# Patient Record
Sex: Male | Born: 1985 | Race: Black or African American | Hispanic: No | Marital: Single | Smoking: Current every day smoker
Health system: Southern US, Community
[De-identification: ages and names within clinical notes are randomized; demographics above are authoritative.]

## PROBLEM LIST (undated history)

## (undated) HISTORY — PX: APPENDECTOMY: SHX54

---

## 2008-11-22 ENCOUNTER — Emergency Department (HOSPITAL_BASED_OUTPATIENT_CLINIC_OR_DEPARTMENT_OTHER): Admission: EM | Admit: 2008-11-22 | Discharge: 2008-11-22 | Payer: Self-pay | Admitting: Emergency Medicine

## 2008-11-22 ENCOUNTER — Ambulatory Visit: Payer: Self-pay | Admitting: Diagnostic Radiology
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
## 2011-01-14 LAB — BASIC METABOLIC PANEL
BUN: 12 mg/dL (ref 6–23)
CO2: 27 mEq/L (ref 19–32)
Chloride: 104 mEq/L (ref 96–112)
Creatinine, Ser: 1.2 mg/dL (ref 0.4–1.5)
GFR calc Af Amer: >60 mL/min (ref >60)
Potassium: 4.4 mEq/L (ref 3.5–5.1)

## 2014-09-05 ENCOUNTER — Emergency Department (HOSPITAL_COMMUNITY)
Admission: EM | Admit: 2014-09-05 | Discharge: 2014-09-05 | Disposition: A | Discharge status: Home / Self Care | Payer: Self-pay | Attending: Emergency Medicine | Admitting: Emergency Medicine

## 2014-09-05 ENCOUNTER — Encounter (HOSPITAL_COMMUNITY): Payer: Self-pay | Admitting: Emergency Medicine

## 2014-09-05 DIAGNOSIS — Z791 Long term (current) use of non-steroidal anti-inflammatories (NSAID): Secondary | ICD-10-CM | POA: Insufficient documentation

## 2014-09-05 DIAGNOSIS — K088 Other specified disorders of teeth and supporting structures: Secondary | ICD-10-CM | POA: Insufficient documentation

## 2014-09-05 DIAGNOSIS — Z720 Tobacco use: Secondary | ICD-10-CM | POA: Insufficient documentation

## 2014-09-05 DIAGNOSIS — T85848A Pain due to other internal prosthetic devices, implants and grafts, initial encounter: Secondary | ICD-10-CM

## 2014-09-05 DIAGNOSIS — Z880 Allergy status to penicillin: Secondary | ICD-10-CM | POA: Insufficient documentation

## 2014-09-05 MED ORDER — TRAMADOL HCL 50 MG PO TABS: 50.0000 mg | ORAL_TABLET | Freq: Two times a day (BID) | ORAL | Status: DC | PRN

## 2014-09-05 MED ORDER — TRAMADOL HCL 50 MG PO TABS
50.0000 mg | ORAL_TABLET | Freq: Once | ORAL | Status: AC
  Administered 2014-09-05: 50 mg via ORAL
  Filled 2014-09-05: qty 1

## 2014-09-05 NOTE — ED Notes (Signed)
Patient c/o right, upper and lower dental pain x2-3 days. Patient has tried OTC Orajel and Ibuprofen without relief. Last does ibuprofen PM @0030 . Patient states he does not have a Education officer, communitydentist.

## 2014-09-05 NOTE — Discharge Instructions (Signed)
Dental Pain Dale Wells, you were seen today for dental pain. You need to have her teeth pulled by a dentist. Take pain medication as prescribed.  Continue to take Motrin as well. If your symptoms worsen or if you develop an abscess, come back to emergency department for evaluation. Toothache is pain in or around a tooth. It may get worse with chewing or with cold or heat.  HOME CARE  Your dentist may use a numbing medicine during treatment. If so, you may need to avoid eating until the medicine wears off. Ask your dentist about this.  Only take medicine as told by your dentist or doctor.  Avoid chewing food near the painful tooth until after all treatment is done. Ask your dentist about this. GET HELP RIGHT AWAY IF:   The problem gets worse or new problems appear.  You have a fever.  There is redness and puffiness (swelling) of the face, jaw, or neck.  You cannot open your mouth.  There is pain in the jaw.  There is very bad pain that is not helped by medicine. MAKE SURE YOU:   Understand these instructions.  Will watch your condition.  Will get help right away if you are not doing well or get worse. Document Released: 03/03/2008 Document Revised: 12/08/2011 Document Reviewed: 03/03/2008 [facility] Patient Information 2015 [place]. This information is not intended to replace advice given to you by your health care provider. Make sure you discuss any questions you have with your health care provider.

## 2014-09-05 NOTE — ED Provider Notes (Signed)
CSN: [id number]     Arrival date & time 09/05/14  0234 History   First MD Initiated Contact with Patient 09/05/14 0246     Chief Complaint  Patient presents with  . Dental Pain    right sided, upper & lower     (Consider location/radiation/quality/duration/timing/severity/associated sxs/prior Treatment) HPI  Dale Wells is a 28 y.o. male with no second past medical history coming in with dental pain. Patient states he has pain in his #1 through 3 teeth as well as his #30 through 32 teeth. This is occurred for the past week. He denies any but states that has been swelling. He has taken Orajel and Motrin without any relief. He states he has had multiple cavities and has had teeth pulled in the past. Patient currently does not have a dentist. Denies any fevers, he has no further complaints.  10 Systems reviewed and are negative for acute change except as noted in the HPI.    History reviewed. No pertinent past medical history. Past Surgical History  Procedure Laterality Date  . Appendectomy     No family history on file. History  Substance Use Topics  . Smoking status: Current Every Day Smoker -- 0.50 packs/day    Types: Cigarettes  . Smokeless tobacco: Not on file  . Alcohol Use: No    Review of Systems    Allergies  Penicillins  Home Medications   Prior to Admission medications   Medication Sig Start Date End Date Taking? Authorizing Provider  benzocaine (ORAJEL) 10 % mucosal gel Use as directed 1 application in the mouth or throat as needed for mouth pain.   Yes Historical Provider, MD  ibuprofen (ADVIL,MOTRIN) 200 MG tablet Take 400 mg by mouth every 6 (six) hours as needed for moderate pain.   Yes Historical Provider, MD  Ibuprofen-Diphenhydramine Cit (ADVIL PM) 200-38 MG TABS Take 4 tablets by mouth at bedtime as needed (pain).   Yes Historical Provider, MD  naproxen sodium (ANAPROX) 220 MG tablet Take 220 mg by mouth 2 (two) times daily as needed (pain).   Yes  Historical Provider, MD   BP 139/70 mmHg  Pulse 61  Temp(Src) 98.5 F (36.9 C)  Resp 18  Ht 6' (1.829 m)  Wt 166 lb (75.297 kg)  BMI 22.51 kg/m2  SpO2 99% Physical Exam  Constitutional: He is oriented to person, place, and time. Vital signs are normal. He appears well-developed and well-nourished.  Non-toxic appearance. He does not appear ill. No distress.  HENT:  Head: Normocephalic and atraumatic.  Nose: Nose normal.  Mouth/Throat: Oropharynx is clear and moist. No oropharyngeal exudate.  Poor dentition diffusely, worse at tooth #31 with obvious distraction and it is tender to palpation. There is no nerve exposure.  Eyes: Conjunctivae and EOM are normal. Pupils are equal, round, and reactive to light. No scleral icterus.  Neck: Normal range of motion. Neck supple. No tracheal deviation, no edema, no erythema and normal range of motion present. No thyroid mass and no thyromegaly present.  Cardiovascular: Normal rate, regular rhythm, S1 normal, S2 normal, normal heart sounds, intact distal pulses and normal pulses.  Exam reveals no gallop and no friction rub.   No murmur heard. Pulses:      Radial pulses are 2+ on the right side, and 2+ on the left side.       Dorsalis pedis pulses are 2+ on the right side, and 2+ on the left side.  Pulmonary/Chest: Effort normal and breath sounds normal. No  respiratory distress. He has no wheezes. He has no rhonchi. He has no rales.  Abdominal: Soft. Normal appearance and bowel sounds are normal. He exhibits no distension, no ascites and no mass. There is no hepatosplenomegaly. There is no tenderness. There is no rebound, no guarding and no CVA tenderness.  Musculoskeletal: Normal range of motion. He exhibits no edema or tenderness.  Lymphadenopathy:    He has no cervical adenopathy.  Neurological: He is alert and oriented to person, place, and time. He has normal strength. No cranial nerve deficit or sensory deficit. GCS eye subscore is 4. GCS verbal  subscore is 5. GCS motor subscore is 6.  Skin: Skin is warm, dry and intact. No petechiae and no rash noted. He is not diaphoretic. No erythema. No pallor.  Psychiatric: He has a normal mood and affect. His behavior is normal. Judgment normal.  Nursing note and vitals reviewed.   ED Course  Procedures (including critical care time) Labs Review Labs Reviewed - No data to display  Imaging Review No results found.   EKG Interpretation None      MDM   Final diagnoses:  None    Patient present to emergency department for dental pain. There is no obvious swelling or abscess seen. He will be given tramadol for more pain relief, advised to continue Motrin. He was advised to follow-up with a dentist for extraction. His vital signs were within his normal limits and he is safe for discharge.    Tomasita CrumbleAdeleke Faron Whitelock, MD 09/05/14 [phone]

## 2014-09-05 NOTE — ED Notes (Signed)
MD at bedside. 

## 2015-10-21 ENCOUNTER — Encounter (HOSPITAL_COMMUNITY): Payer: Self-pay | Admitting: *Deleted

## 2015-10-21 ENCOUNTER — Emergency Department (HOSPITAL_COMMUNITY)
Admission: EM | Admit: 2015-10-21 | Discharge: 2015-10-21 | Disposition: A | Discharge status: Home / Self Care | Payer: Self-pay | Attending: Emergency Medicine | Admitting: Emergency Medicine

## 2015-10-21 DIAGNOSIS — R300 Dysuria: Secondary | ICD-10-CM | POA: Insufficient documentation

## 2015-10-21 DIAGNOSIS — F17210 Nicotine dependence, cigarettes, uncomplicated: Secondary | ICD-10-CM | POA: Insufficient documentation

## 2015-10-21 DIAGNOSIS — Z113 Encounter for screening for infections with a predominantly sexual mode of transmission: Secondary | ICD-10-CM | POA: Insufficient documentation

## 2015-10-21 DIAGNOSIS — Z880 Allergy status to penicillin: Secondary | ICD-10-CM | POA: Insufficient documentation

## 2015-10-21 LAB — URINALYSIS, ROUTINE W REFLEX MICROSCOPIC
BILIRUBIN URINE: NEGATIVE
Glucose, UA: NEGATIVE mg/dL
HGB URINE DIPSTICK: NEGATIVE
KETONES UR: NEGATIVE mg/dL
NITRITE: NEGATIVE
PROTEIN: NEGATIVE mg/dL
SPECIFIC GRAVITY, URINE: 1.026 (ref 1.005–1.030)
pH: 6 (ref 5.0–8.0)

## 2015-10-21 LAB — URINE MICROSCOPIC-ADD ON
Bacteria, UA: NONE SEEN
RBC / HPF: NONE SEEN RBC/hpf (ref 0–5)
SQUAMOUS EPITHELIAL / LPF: NONE SEEN

## 2015-10-21 MED ORDER — LIDOCAINE HCL 2 % IJ SOLN
20.0000 mL | Freq: Once | INTRAMUSCULAR | Status: AC
  Administered 2015-10-21: 30 mg

## 2015-10-21 MED ORDER — AZITHROMYCIN 250 MG PO TABS
1000.0000 mg | ORAL_TABLET | Freq: Once | ORAL | Status: AC
  Administered 2015-10-21: 1000 mg via ORAL
  Filled 2015-10-21: qty 4

## 2015-10-21 MED ORDER — CEFTRIAXONE SODIUM 250 MG IJ SOLR
250.0000 mg | Freq: Once | INTRAMUSCULAR | Status: AC
  Administered 2015-10-21: 250 mg via INTRAMUSCULAR
  Filled 2015-10-21: qty 250

## 2015-10-21 NOTE — ED Notes (Signed)
Pt reports possible STD exposure.  Reports "tingling" sensation when urinating.

## 2015-10-21 NOTE — Discharge Instructions (Signed)
You were seen in the ER today for screening for STIs. Your urine test did show signs of possible infection. We treated you empirically with antibiotics. We will call you in a few days if any of your tests come back positive.   Return to the emergency room for worsening condition or new concerning symptoms. Follow up with your regular doctor. If you don't have a regular doctor use one of the numbers below to establish a primary care doctor.   Emergency Department Resource Guide 1) Find a Doctor and Pay Out of Pocket Although you won't have to find out who is covered by your insurance plan, it is a good idea to ask around and get recommendations. You will then need to call the office and see if the doctor you have chosen will accept you as a new patient and what types of options they offer for patients who are self-pay. Some doctors offer discounts or will set up payment plans for their patients who do not have insurance, but you will need to ask so you aren't surprised when you get to your appointment.  2) Contact Your Local Health Department Not all health departments have doctors that can see patients for sick visits, but many do, so it is worth a call to see if yours does. If you don't know where your local health department is, you can check in your phone book. The CDC also has a tool to help you locate your state's health department, and many state websites also have listings of all of their local health departments.  3) Find a Walk-in Clinic If your illness is not likely to be very severe or complicated, you may want to try a walk in clinic. These are popping up all over the country in pharmacies, drugstores, and shopping centers. They're usually staffed by nurse practitioners or physician assistants that have been trained to treat common illnesses and complaints. They're usually fairly quick and inexpensive. However, if you have serious medical issues or chronic medical problems, these are  probably not your best option.  No Primary Care Doctor: - Call Health Connect at  [phone] - they can help you locate a primary care doctor that  accepts your insurance, provides certain services, etc. - Physician Referral [phone]  Emergency Department Resource Guide 1) Find a Doctor and Pay Out of Pocket Although you won't have to find out who is covered by your insurance plan, it is a good idea to ask around and get recommendations. You will then need to call the office and see if the doctor you have chosen will accept you as a new patient and what types of options they offer for patients who are self-pay. Some doctors offer discounts or will set up payment plans for their patients who do not have insurance, but you will need to ask so you aren't surprised when you get to your appointment.  2) Contact Your Local Health Department Not all health departments have doctors that can see patients for sick visits, but many do, so it is worth a call to see if yours does. If you don't know where your local health department is, you can check in your phone book. The CDC also has a tool to help you locate your state's health department, and many state websites also have listings of all of their local health departments.  3) Find a Walk-in Clinic If your illness is not likely to be very severe or complicated, you may want to try  a walk in clinic. These are popping up all over the country in pharmacies, drugstores, and shopping centers. They're usually staffed by nurse practitioners or physician assistants that have been trained to treat common illnesses and complaints. They're usually fairly quick and inexpensive. However, if you have serious medical issues or chronic medical problems, these are probably not your best option.  No Primary Care Doctor: - Call Health Connect at  [phone] - they can help you locate a primary care doctor that  accepts your insurance, provides certain services,  etc. - Physician Referral Service- [phone]  Chronic Pain Problems: Organization         Address  Phone   Notes  [facility]  [phone] Patients need to be referred by their primary care doctor.   Medication Assistance: Organization         Address  Phone   Notes  [facility] [address] [place], [place] [address] [phone] --Must be a resident of [facility] -- Must have NO insurance coverage whatsoever (no Medicaid/ Medicare, etc.) -- The pt. MUST have a primary care doctor that directs their care regularly and follows them in the community   Med[facility]ist  [phone]   [employer]  [phone]    Agencies that provide inexpensive medical care: Organization         Address  Phone   Notes  [facility]  [phone]   [facility]    [phone]   [facility] [address] [place], [place] [address] [phone]   [facility] [address], [place] [phone]   [facility]    [phone]   [facility]    [phone]   [facility]  [address], [place] Phone:  [phone], Fax:  [phone] Hours of Operation:  9 am - 6 pm, M-F.  Also accepts Medicaid/Medicare and self-pay.  [facility]  [address], [place] Phone: [phone], Fax: [phone]. Hours of Operation:  8:30 am - 5:30 pm, M-F.  Also accepts Medicaid and self-pay.  [facility] [place] [address], [place] Phone: [phone]) [phone]   [facility] [address], [place], [place] [phone], Ext. [phone] Mondays & Thursdays: 7-9 AM.  First 15 patients are seen on a first come, first serve basis.    Medicaid-accepting [facility] Providers:  Organization         Address  Phone   Notes  [facility] [address],  [phone] Also accepts self-pay patients.  [facility] [address], [place]  [phone]   [facility] [address], [place] [phone]   [facility] [address] [place] [address], [place] [phone]   Renaye RakersVeita Bland [address], [place]   [phone] Only accepts [place] patients after they have their name applied to their card.   Self-Pay (no insurance) in [facility]:  Organization         Address  Phone   Notes  Sickle Cell Patients, [facility] [address], [place] [phone]) [phone]   [facility] [address], [place] [phone]  [facility] [place]  [address], [place] [phone]   [facility]/Dr. Osei-Bonsu  [address] [place] or [address], [place] [phone] Phone number for both [place] and [place] locations is the same.  [facility] [address] [place] [phone]   [facility] [address], [place] or [address] [phone]   [facility] [address], [place] [phone], phone; [phone], fax Sees patients 1st and 3rd Saturday of every month.  Must not qualify for public or private insurance (i.e. Medicaid, Medicare, [place] Choice, Veterans' Benefits)  Household income should be no more than 200% of the poverty level The clinic cannot treat you if you are pregnant or think you are pregnant  Sexually transmitted diseases are not treated at the clinic.

## 2015-10-21 NOTE — ED Provider Notes (Signed)
CSN: [id number]     Arrival date & time 10/21/15  1003 History   First MD Initiated Contact with Patient 10/21/15 1018     Chief Complaint  Patient presents with  . SEXUALLY TRANSMITTED DISEASE    HPI  Dale Wells is an 30 y.o. male with no significant PMH who presents to the ED for STD screening and exposure. He is accompanied by his partner who states she was recently diagnosed with chlamydia. Pt states that he has had dysuria for the past two weeks but thought it was due to dehydration. Denies penile discharge or new lesions. Denies penile or scrotal pain. Denies hematuria. Denies abd pain, n/v/d, fever, chills. Pt would like HIV and RPR testing today as well.  History reviewed. No pertinent past medical history. Past Surgical History  Procedure Laterality Date  . Appendectomy     No family history on file. Social History  Substance Use Topics  . Smoking status: Current Every Day Smoker -- 0.50 packs/day    Types: Cigarettes  . Smokeless tobacco: None  . Alcohol Use: No    Review of Systems  All other systems reviewed and are negative.     Allergies  Penicillins  Home Medications   Prior to Admission medications   Medication Sig Start Date End Date Taking? Authorizing Provider  benzocaine (ORAJEL) 10 % mucosal gel Use as directed 1 application in the mouth or throat as needed for mouth pain.    Historical Provider, MD  ibuprofen (ADVIL,MOTRIN) 200 MG tablet Take 400 mg by mouth every 6 (six) hours as needed for moderate pain.    Historical Provider, MD  Ibuprofen-Diphenhydramine Cit (ADVIL PM) 200-38 MG TABS Take 4 tablets by mouth at bedtime as needed (pain).    Historical Provider, MD  naproxen sodium (ANAPROX) 220 MG tablet Take 220 mg by mouth 2 (two) times daily as needed (pain).    Historical Provider, MD  traMADol (ULTRAM) 50 MG tablet Take 1 tablet (50 mg total) by mouth every 12 (twelve) hours as needed for severe pain. 09/05/14   Tomasita Crumble, MD   BP 129/76 mmHg   Pulse 82  Temp(Src) 97.6 F (36.4 C) (Oral)  Resp 16  SpO2 100% Physical Exam  Constitutional: He is oriented to person, place, and time. He appears well-developed and well-nourished.  HENT:  Right Ear: External ear normal.  Left Ear: External ear normal.  Mouth/Throat: Oropharynx is clear and moist.  Eyes: No scleral icterus.  Neck: No tracheal deviation present.  Cardiovascular: Normal rate and regular rhythm.   Pulmonary/Chest: Effort normal. No respiratory distress. He exhibits no tenderness.  Abdominal: Soft. He exhibits no distension. There is no tenderness.  Genitourinary: Testes normal and penis normal. Circumcised. No penile tenderness. No discharge found.  Neurological: He is alert and oriented to person, place, and time.  Psychiatric: He has a normal mood and affect. His behavior is normal.  Nursing note and vitals reviewed.   ED Course  Procedures (including critical care time) Labs Review Labs Reviewed  URINALYSIS, ROUTINE W REFLEX MICROSCOPIC (NOT AT [facility]) - Abnormal; Notable for the following:    Leukocytes, UA SMALL (*)    All other components within normal limits  URINE CULTURE  URINE MICROSCOPIC-ADD ON  RPR  HIV ANTIBODY (ROUTINE TESTING)  GC/CHLAMYDIA PROBE AMP ([place]) NOT AT [facility]    Imaging Review No results found. I have personally reviewed and evaluated these images and lab results as part of my medical decision-making.  EKG Interpretation None      MDM   Final diagnoses:  Encounter for screening examination for sexually transmitted disease  Dysuria    Will check UA, send off gc/chlamydia, HIV, and RPR. Given pt's partner with known diagnosis of chlamydia and pt's symptoms, will treat empirically with rocephin and azithromycin.   UA with small leuks. Sent for culture. Pt given rocephin and azithromycin. Pt discharged home with resource guide to establish PCP for f/u. ER return precautions given.   Carlene Coria, PA-C 10/21/15  1101  Benjiman Core, MD 10/21/15 [phone]

## 2015-10-22 LAB — RPR: RPR Ser Ql: NONREACTIVE

## 2015-10-22 LAB — URINE CULTURE

## 2015-10-22 LAB — GC/CHLAMYDIA PROBE AMP (~~LOC~~) NOT AT ARMC
Chlamydia: POSITIVE — AB
Neisseria Gonorrhea: NEGATIVE

## 2015-10-22 LAB — HIV ANTIBODY (ROUTINE TESTING W REFLEX): HIV Screen 4th Generation wRfx: NONREACTIVE

## 2015-10-23 ENCOUNTER — Telehealth (HOSPITAL_COMMUNITY): Payer: Self-pay
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
## 2016-01-04 ENCOUNTER — Encounter (HOSPITAL_BASED_OUTPATIENT_CLINIC_OR_DEPARTMENT_OTHER): Payer: Self-pay | Admitting: Emergency Medicine

## 2016-01-04 ENCOUNTER — Emergency Department (HOSPITAL_BASED_OUTPATIENT_CLINIC_OR_DEPARTMENT_OTHER)
Admission: EM | Admit: 2016-01-04 | Discharge: 2016-01-04 | Disposition: A | Discharge status: Home / Self Care | Payer: Self-pay | Attending: Emergency Medicine | Admitting: Emergency Medicine

## 2016-01-04 DIAGNOSIS — Z880 Allergy status to penicillin: Secondary | ICD-10-CM | POA: Insufficient documentation

## 2016-01-04 DIAGNOSIS — A64 Unspecified sexually transmitted disease: Secondary | ICD-10-CM | POA: Insufficient documentation

## 2016-01-04 DIAGNOSIS — F17210 Nicotine dependence, cigarettes, uncomplicated: Secondary | ICD-10-CM | POA: Insufficient documentation

## 2016-01-04 MED ORDER — CEFTRIAXONE SODIUM 250 MG IJ SOLR
250.0000 mg | Freq: Once | INTRAMUSCULAR | Status: AC
  Administered 2016-01-04: 250 mg via INTRAMUSCULAR
  Filled 2016-01-04: qty 250

## 2016-01-04 MED ORDER — AZITHROMYCIN 250 MG PO TABS
1000.0000 mg | ORAL_TABLET | Freq: Once | ORAL | Status: AC
  Administered 2016-01-04: 1000 mg via ORAL
  Filled 2016-01-04: qty 4

## 2016-01-04 MED ORDER — LIDOCAINE HCL (PF) 1 % IJ SOLN
INTRAMUSCULAR | Status: AC
  Administered 2016-01-04: 2.5 mL
  Filled 2016-01-04: qty 5

## 2016-01-04 NOTE — ED Notes (Signed)
STD exposure.  Pt having symptoms:  Penile drainage.

## 2016-01-04 NOTE — Discharge Instructions (Signed)
Sexually Transmitted Disease °A sexually transmitted disease (STD) is a disease or infection that may be passed (transmitted) from person to person, usually during sexual activity. This may happen by way of saliva, semen, blood, vaginal mucus, or urine. Common STDs include: °· Gonorrhea. °· Chlamydia. °· Syphilis. °· HIV and AIDS. °· Genital herpes. °· Hepatitis B and C. °· Trichomonas. °· Human papillomavirus (HPV). °· Pubic lice. °· Scabies. °· Mites. °· Bacterial vaginosis. °WHAT ARE CAUSES OF STDs? °An STD may be caused by bacteria, a virus, or parasites. STDs are often transmitted during sexual activity if one person is infected. However, they may also be transmitted through nonsexual means. STDs may be transmitted after:  °· Sexual intercourse with an infected person. °· Sharing sex toys with an infected person. °· Sharing needles with an infected person or using unclean piercing or tattoo needles. °· Having intimate contact with the genitals, mouth, or rectal areas of an infected person. °· Exposure to infected fluids during birth. °WHAT ARE THE SIGNS AND SYMPTOMS OF STDs? °Different STDs have different symptoms. Some people may not have any symptoms. If symptoms are present, they may include: °· Painful or bloody urination. °· Pain in the pelvis, abdomen, vagina, anus, throat, or eyes. °· A skin rash, itching, or irritation. °· Growths, ulcerations, blisters, or sores in the genital and anal areas. °· Abnormal vaginal discharge with or without bad odor. °· Penile discharge in men. °· Fever. °· Pain or bleeding during sexual intercourse. °· Swollen glands in the groin area. °· Yellow skin and eyes (jaundice). This is seen with hepatitis. °· Swollen testicles. °· Infertility. °· Sores and blisters in the mouth. °HOW ARE STDs DIAGNOSED? °To make a diagnosis, your health care provider may: °· Take a medical history. °· Perform a physical exam. °· Take a sample of any discharge to examine. °· Swab the throat,  cervix, opening to the penis, rectum, or vagina for testing. °· Test a sample of your first morning urine. °· Perform blood tests. °· Perform a Pap test, if this applies. °· Perform a colposcopy. °· Perform a laparoscopy. °HOW ARE STDs TREATED? °Treatment depends on the STD. Some STDs may be treated but not cured. °· Chlamydia, gonorrhea, trichomonas, and syphilis can be cured with antibiotic medicine. °· Genital herpes, hepatitis, and HIV can be treated, but not cured, with prescribed medicines. The medicines lessen symptoms. °· Genital warts from HPV can be treated with medicine or by freezing, burning (electrocautery), or surgery. Warts may come back. °· HPV cannot be cured with medicine or surgery. However, abnormal areas may be removed from the cervix, vagina, or vulva. °· If your diagnosis is confirmed, your recent sexual partners need treatment. This is true even if they are symptom-free or have a negative culture or evaluation. They should not have sex until their health care providers say it is okay. °· Your health care provider may test you for infection again 3 months after treatment. °HOW CAN I REDUCE MY RISK OF GETTING AN STD? °Take these steps to reduce your risk of getting an STD: °· Use latex condoms, dental dams, and water-soluble lubricants during sexual activity. Do not use petroleum jelly or oils. °· Avoid having multiple sex partners. °· Do not have sex with someone who has other sex partners °· Do not have sex with anyone you do not know or who is at high risk for an STD. °· Avoid risky sex practices that can break your skin. °· Do not have sex   if you have open sores on your mouth or skin. °· Avoid drinking too much alcohol or taking illegal drugs. Alcohol and drugs can affect your judgment and put you in a vulnerable position. °· Avoid engaging in oral and anal sex acts. °· Get vaccinated for HPV and hepatitis. If you have not received these vaccines in the past, talk to your health care  provider about whether one or both might be right for you. °· If you are at risk of being infected with HIV, it is recommended that you take a prescription medicine daily to prevent HIV infection. This is called pre-exposure prophylaxis (PrEP). You are considered at risk if: °¨ You are a man who has sex with other men (MSM). °¨ You are a heterosexual man or woman and are sexually active with more than one partner. °¨ You take drugs by injection. °¨ You are sexually active with a partner who has HIV. °· Talk with your health care provider about whether you are at high risk of being infected with HIV. If you choose to begin PrEP, you should first be tested for HIV. You should then be tested every 3 months for as long as you are taking PrEP. °WHAT SHOULD I DO IF I THINK I HAVE AN STD? °· See your health care provider. °· Tell your sexual partner(s). They should be tested and treated for any STDs. °· Do not have sex until your health care provider says it is okay. °WHEN SHOULD I GET IMMEDIATE MEDICAL CARE? °Contact your health care provider right away if:  °· You have severe abdominal pain. °· You are a man and notice swelling or pain in your testicles. °· You are a woman and notice swelling or pain in your vagina. °  °This information is not intended to replace advice given to you by your health care provider. Make sure you discuss any questions you have with your health care provider. °  °Document Released: 12/06/2002 Document Revised: 10/06/2014 Document Reviewed: 04/05/2013 °Elsevier Interactive Patient Education ©2016 Elsevier Inc. ° °

## 2016-01-04 NOTE — ED Provider Notes (Signed)
CSN: [id number]     Arrival date & time 01/04/16  [id number] History   First MD Initiated Contact with Patient 01/04/16 [id number]     Chief Complaint  Patient presents with  . Exposure to STD    HPI Patient presents to the emergency department with complaints of discharge from his penis over the past 24 hours.  He has had a sexual transmitted infection in the past.  He underwent appropriate treatment and states that his partner did.  He's had no new partners.  He continues to have unprotected sex.  He has no other complaints at this time.   No past medical history on file. Past Surgical History  Procedure Laterality Date  . Appendectomy     No family history on file. Social History  Substance Use Topics  . Smoking status: Current Every Day Smoker -- 0.50 packs/day    Types: Cigarettes  . Smokeless tobacco: None  . Alcohol Use: No    Review of Systems  All other systems reviewed and are negative.     Allergies  Penicillins  Home Medications   Prior to Admission medications   Not on File   BP 131/78 mmHg  Pulse 72  Temp(Src) 98.6 F (37 C) (Oral)  Resp 20  Ht 6' (1.829 m)  Wt 175 lb (79.379 kg)  BMI 23.73 kg/m2  SpO2 100% Physical Exam  Constitutional: He is oriented to person, place, and time. He appears well-developed and well-nourished.  HENT:  Head: Normocephalic.  Eyes: EOM are normal.  Neck: Normal range of motion.  Pulmonary/Chest: Effort normal.  Abdominal: He exhibits no distension.  Genitourinary:  Normal-appearing uncircumcised penis.  No skin lesions noted.  No discharge noted.  Musculoskeletal: Normal range of motion.  Neurological: He is alert and oriented to person, place, and time.  Psychiatric: He has a normal mood and affect.  Nursing note and vitals reviewed.   ED Course  Procedures (including critical care time) Labs Review Labs Reviewed  RPR  HIV ANTIBODY (ROUTINE TESTING)  GC/CHLAMYDIA PROBE AMP () NOT AT [facility]    Imaging  Review No results found. I have personally reviewed and evaluated these images and lab results as part of my medical decision-making.   EKG Interpretation None      MDM   Final diagnoses:  STI (sexually transmitted infection)    Patient be treated for suspected gonorrhea or chlamydia.  RPR sent.  HIV sent.  Patient understands to have all sexual partners seen and evaluated and treated at the health department.  He understands importance of using condoms for protection    Azalia BilisKevin Bernhard Koskinen, MD 01/04/16 [id number]

## 2016-01-05 LAB — HIV ANTIBODY (ROUTINE TESTING W REFLEX): HIV Screen 4th Generation wRfx: NONREACTIVE

## 2016-01-07 LAB — GC/CHLAMYDIA PROBE AMP (~~LOC~~) NOT AT ARMC
CHLAMYDIA, DNA PROBE: NEGATIVE
Neisseria Gonorrhea: POSITIVE — AB

## 2016-01-07 LAB — RPR: RPR: REACTIVE — AB

## 2016-01-07 LAB — RPR, QUANT+TP ABS (REFLEX): T Pallidum Abs: NEGATIVE

## 2016-01-15 ENCOUNTER — Telehealth: Payer: Self-pay | Admitting: *Deleted

## 2016-01-15 NOTE — ED Notes (Signed)
Unable to contact via phone and letter returned.

## 2016-01-25 ENCOUNTER — Emergency Department (HOSPITAL_BASED_OUTPATIENT_CLINIC_OR_DEPARTMENT_OTHER)
Admission: EM | Admit: 2016-01-25 | Discharge: 2016-01-25 | Disposition: A | Discharge status: Home / Self Care | Payer: Self-pay | Attending: Emergency Medicine | Admitting: Emergency Medicine

## 2016-01-25 ENCOUNTER — Encounter (HOSPITAL_BASED_OUTPATIENT_CLINIC_OR_DEPARTMENT_OTHER): Payer: Self-pay | Admitting: Emergency Medicine

## 2016-01-25 DIAGNOSIS — A539 Syphilis, unspecified: Secondary | ICD-10-CM

## 2016-01-25 DIAGNOSIS — F17210 Nicotine dependence, cigarettes, uncomplicated: Secondary | ICD-10-CM | POA: Insufficient documentation

## 2016-01-25 DIAGNOSIS — Z202 Contact with and (suspected) exposure to infections with a predominantly sexual mode of transmission: Secondary | ICD-10-CM | POA: Insufficient documentation

## 2016-01-25 LAB — URINALYSIS, ROUTINE W REFLEX MICROSCOPIC
BILIRUBIN URINE: NEGATIVE
Glucose, UA: NEGATIVE mg/dL
Hgb urine dipstick: NEGATIVE
KETONES UR: NEGATIVE mg/dL
LEUKOCYTES UA: NEGATIVE
NITRITE: NEGATIVE
PROTEIN: NEGATIVE mg/dL
Specific Gravity, Urine: 1.013 (ref 1.005–1.030)
pH: 6 (ref 5.0–8.0)

## 2016-01-25 MED ORDER — DEXAMETHASONE SODIUM PHOSPHATE 10 MG/ML IJ SOLN
10.0000 mg | Freq: Once | INTRAMUSCULAR | Status: AC
  Administered 2016-01-25: 10 mg via INTRAMUSCULAR
  Filled 2016-01-25: qty 1

## 2016-01-25 MED ORDER — CEFTRIAXONE SODIUM 250 MG IJ SOLR
250.0000 mg | Freq: Once | INTRAMUSCULAR | Status: AC
  Administered 2016-01-25: 250 mg via INTRAMUSCULAR
  Filled 2016-01-25: qty 250

## 2016-01-25 MED ORDER — DIPHENHYDRAMINE HCL 50 MG/ML IJ SOLN
25.0000 mg | Freq: Once | INTRAMUSCULAR | Status: AC
  Administered 2016-01-25: 25 mg via INTRAMUSCULAR
  Filled 2016-01-25: qty 1

## 2016-01-25 MED ORDER — AZITHROMYCIN 250 MG PO TABS
1000.0000 mg | ORAL_TABLET | Freq: Once | ORAL | Status: AC
  Administered 2016-01-25: 1000 mg via ORAL
  Filled 2016-01-25: qty 4

## 2016-01-25 MED ORDER — PENICILLIN G BENZATHINE 1200000 UNIT/2ML IM SUSP
2.4000 10*6.[IU] | Freq: Once | INTRAMUSCULAR | Status: AC
  Administered 2016-01-25: 2.4 10*6.[IU] via INTRAMUSCULAR
  Filled 2016-01-25: qty 4

## 2016-01-25 NOTE — ED Notes (Addendum)
Patient reports that "my partner has something and I need to get treated".  Patient states partner was recently diagnosed with gonorrhea.

## 2016-01-25 NOTE — Discharge Instructions (Signed)
You will need to be retested in 6 months.  Syphilis Syphilis is an infectious disease. It can cause serious complications if left untreated.  CAUSES  Syphilis is caused by a type of bacteria called Treponema pallidum. It is most commonly spread through sexual contact. Syphilis may also spread to a fetus through the blood of the mother.  SIGNS AND SYMPTOMS Symptoms vary depending on the stage of the disease. Some symptoms may disappear without treatment. However, this does not mean that the infection is gone. One form of syphilis (called latent syphilis) has no symptoms.  Primary Syphilis  Painless sores (chancres) in and around the genital organs and mouth.  Swollen lymph nodes near the sores. Secondary Syphilis  A rash or sores over any portion of the body, including the palms of the hands and soles of the feet.  Fever.  Headache.  Sore throat.  Swollen lymph nodes.  New sores in the mouth or on the genitals.  Feeling generally ill.  Having pain in the joints. Tertiary Syphilis The third stage of syphilis involves severe damage to different organs in the body, such as the brain, spinal cord, and heart. Signs and symptoms may include:   Dementia.  Personality and mood changes.  Difficulty walking.  Heart failure.  Fainting.  Enlargement (aneurysm) of the aorta.  Tumors of the skin, bones, or liver.  Muscle weakness.  Sudden "lightning" pains, numbness, or tingling.  Problems with coordination.  Vision changes. DIAGNOSIS   A physical exam will be done.  Blood tests will be done to confirm the diagnosis.  If the disease is in the first or second stages, a fluid (drainage) sample from a sore or rash may be examined under a microscope to detect the disease-causing bacteria.  Fluid around the spine may need to be examined to detect brain damage or inflammation of the brain lining (meningitis).  If the disease is in the third stage, X-rays, CT scans, MRIs,  echocardiograms, ultrasounds, or cardiac catheterization may also be done to detect disease of the heart, aorta, or brain. TREATMENT  Syphilis can be cured with antibiotic medicine if a diagnosis is made early. During the first day of treatment, you may experience fever, chills, headache, nausea, or aching all over your body. This is a normal reaction to the antibiotics.  HOME CARE INSTRUCTIONS   Take your antibiotic medicine as directed by your health care provider. Finish the antibiotic even if you start to feel better. Incomplete treatment will put you at risk for continued infection and could be life threatening.  Take medicines only as directed by your health care provider.  Do not have sexual intercourse until your treatment is completed or as directed by your health care provider.  Inform your recent sexual partners that you were diagnosed with syphilis. They need to seek care and treatment, even if they have no symptoms. It is necessary that all your sexual partners be tested for infection and treated if they have the disease.  Keep all follow-up visits as directed by your health care provider. It is important to keep all your appointments.  If your test results are not ready during your visit, make an appointment with your health care provider to find out the results. Do not assume everything is normal if you have not heard from your health care provider or the medical facility. It is your responsibility to get your test results. SEEK MEDICAL CARE IF:  You continue to have any of the following 24 hours  after beginning treatment:  Fever.  Chills.  Headache.  Nausea.  Aching all over your body.  You have symptoms of an allergic reaction to medicine, such as:  Chills.  A headache.  Light-headedness.  A new rash (especially hives).  Difficulty breathing. MAKE SURE YOU:   Understand these instructions.  Will watch your condition.  Will get help right away if you are  not doing well or get worse.   This information is not intended to replace advice given to you by your health care provider. Make sure you discuss any questions you have with your health care provider.   Document Released: 07/06/2013 Document Revised: 10/06/2014 Document Reviewed: 07/06/2013 Elsevier Interactive Patient Education 2016 [employer].  Sexually Transmitted Disease A sexually transmitted disease (STD) is a disease or infection that may be passed (transmitted) from person to person, usually during sexual activity. This may happen by way of saliva, semen, blood, vaginal mucus, or urine. Common STDs include:  Gonorrhea.  Chlamydia.  Syphilis.  HIV and AIDS.  Genital herpes.  Hepatitis B and C.  Trichomonas.  Human papillomavirus (HPV).  Pubic lice.  Scabies.  Mites.  Bacterial vaginosis. WHAT ARE CAUSES OF STDs? An STD may be caused by bacteria, a virus, or parasites. STDs are often transmitted during sexual activity if one person is infected. However, they may also be transmitted through nonsexual means. STDs may be transmitted after:   Sexual intercourse with an infected person.  Sharing sex toys with an infected person.  Sharing needles with an infected person or using unclean piercing or tattoo needles.  Having intimate contact with the genitals, mouth, or rectal areas of an infected person.  Exposure to infected fluids during birth. WHAT ARE THE SIGNS AND SYMPTOMS OF STDs? Different STDs have different symptoms. Some people may not have any symptoms. If symptoms are present, they may include:  Painful or bloody urination.  Pain in the pelvis, abdomen, vagina, anus, throat, or eyes.  A skin rash, itching, or irritation.  Growths, ulcerations, blisters, or sores in the genital and anal areas.  Abnormal vaginal discharge with or without bad odor.  Penile discharge in men.  Fever.  Pain or bleeding during sexual intercourse.  Swollen glands  in the groin area.  Yellow skin and eyes (jaundice). This is seen with hepatitis.  Swollen testicles.  Infertility.  Sores and blisters in the mouth. HOW ARE STDs DIAGNOSED? To make a diagnosis, your health care provider may:  Take a medical history.  Perform a physical exam.  Take a sample of any discharge to examine.  Swab the throat, cervix, opening to the penis, rectum, or vagina for testing.  Test a sample of your first morning urine.  Perform blood tests.  Perform a Pap test, if this applies.  Perform a colposcopy.  Perform a laparoscopy. HOW ARE STDs TREATED? Treatment depends on the STD. Some STDs may be treated but not cured.  Chlamydia, gonorrhea, trichomonas, and syphilis can be cured with antibiotic medicine.  Genital herpes, hepatitis, and HIV can be treated, but not cured, with prescribed medicines. The medicines lessen symptoms.  Genital warts from HPV can be treated with medicine or by freezing, burning (electrocautery), or surgery. Warts may come back.  HPV cannot be cured with medicine or surgery. However, abnormal areas may be removed from the cervix, vagina, or vulva.  If your diagnosis is confirmed, your recent sexual partners need treatment. This is true even if they are symptom-free or have a negative culture  or evaluation. They should not have sex until their health care providers say it is okay.  Your health care provider may test you for infection again 3 months after treatment. HOW CAN I REDUCE MY RISK OF GETTING AN STD? Take these steps to reduce your risk of getting an STD:  Use latex condoms, dental dams, and water-soluble lubricants during sexual activity. Do not use petroleum jelly or oils.  Avoid having multiple sex partners.  Do not have sex with someone who has other sex partners  Do not have sex with anyone you do not know or who is at high risk for an STD.  Avoid risky sex practices that can break your skin.  Do not have sex  if you have open sores on your mouth or skin.  Avoid drinking too much alcohol or taking illegal drugs. Alcohol and drugs can affect your judgment and put you in a vulnerable position.  Avoid engaging in oral and anal sex acts.  Get vaccinated for HPV and hepatitis. If you have not received these vaccines in the past, talk to your health care provider about whether one or both might be right for you.  If you are at risk of being infected with HIV, it is recommended that you take a prescription medicine daily to prevent HIV infection. This is called pre-exposure prophylaxis (PrEP). You are considered at risk if:  You are a man who has sex with other men (MSM).  You are a heterosexual man or woman and are sexually active with more than one partner.  You take drugs by injection.  You are sexually active with a partner who has HIV.  Talk with your health care provider about whether you are at high risk of being infected with HIV. If you choose to begin PrEP, you should first be tested for HIV. You should then be tested every 3 months for as long as you are taking PrEP. WHAT SHOULD I DO IF I THINK I HAVE AN STD?  See your health care provider.  Tell your sexual partner(s). They should be tested and treated for any STDs.  Do not have sex until your health care provider says it is okay. WHEN SHOULD I GET IMMEDIATE MEDICAL CARE? Contact your health care provider right away if:   You have severe abdominal pain.  You are a man and notice swelling or pain in your testicles.  You are a woman and notice swelling or pain in your vagina.   This information is not intended to replace advice given to you by your health care provider. Make sure you discuss any questions you have with your health care provider.   Document Released: 12/06/2002 Document Revised: 10/06/2014 Document Reviewed: 04/05/2013 Elsevier Interactive Patient Education 2016 [employer] Safe Sex Safe sex is about reducing  the risk of giving or getting a sexually transmitted disease (STD). STDs are spread through sexual contact involving the genitals, mouth, or rectum. Some STDs can be cured and others cannot. Safe sex can also prevent unintended pregnancies.  WHAT ARE SOME SAFE SEX PRACTICES?  Limit your sexual activity to only one partner who is having sex with only you.  Talk to your partner about his or her past partners, past STDs, and drug use.  Use a condom every time you have sexual intercourse. This includes vaginal, oral, and anal sexual activity. Both females and males should wear condoms during oral sex. Only use latex or polyurethane condoms and water-based lubricants. Using petroleum-based lubricants or  oils to lubricate a condom will weaken the condom and increase the chance that it will break. The condom should be in place from the beginning to the end of sexual activity. Wearing a condom reduces, but does not completely eliminate, your risk of getting or giving an STD. STDs can be spread by contact with infected body fluids and skin.  Get vaccinated for hepatitis B and HPV.  Avoid alcohol and recreational drugs, which can affect your judgment. You may forget to use a condom or participate in high-risk sex.  For females, avoid douching after sexual intercourse. Douching can spread an infection farther into the reproductive tract.  Check your body for signs of sores, blisters, rashes, or unusual discharge. See your health care provider if you notice any of these signs.  Avoid sexual contact if you have symptoms of an infection or are being treated for an STD. If you or your partner has herpes, avoid sexual contact when blisters are present. Use condoms at all other times.  If you are at risk of being infected with HIV, it is recommended that you take a prescription medicine daily to prevent HIV infection. This is called pre-exposure prophylaxis (PrEP). You are considered at risk if:  You are a man  who has sex with other men (MSM).  You are a heterosexual man or woman who is sexually active with more than one partner.  You take drugs by injection.  You are sexually active with a partner who has HIV.  Talk with your health care provider about whether you are at high risk of being infected with HIV. If you choose to begin PrEP, you should first be tested for HIV. You should then be tested every 3 months for as long as you are taking PrEP.  See your health care provider for regular screenings, exams, and tests for other STDs. Before having sex with a new partner, each of you should be screened for STDs and should talk about the results with each other. WHAT ARE THE BENEFITS OF SAFE SEX?   There is less chance of getting or giving an STD.  You can prevent unwanted or unintended pregnancies.  By discussing safe sex concerns with your partner, you may increase feelings of intimacy, comfort, trust, and honesty between the two of you.   This information is not intended to replace advice given to you by your health care provider. Make sure you discuss any questions you have with your health care provider.   Document Released: 10/23/2004 Document Revised: 10/06/2014 Document Reviewed: 03/08/2012 Elsevier Interactive Patient Education 2016 [employer].  Free HIV and STD Testing These locations offer FREE confidential testing for HIV, Chlamydia, Gonorrhea, and Syphilis. Non-Traditional Testing Sites Address Telephone  [facility] [address] [place] [phone] Mondays 5pm - 7pm  [facility] [address], [address] [place] [phone] Wednesdays 2pm-8pm  [employer] [facility] [address] [place] [phone] Thursdays 9am-12noon 1pm-4pm  [facility] [address] [place] [phone] Tuesdays Thursdays 9am-12noon 1pm-4pm  [facility] Department of [employer] offers free, confidential testing and treatment for HIV, Chlamydia, Gonorrhea, Syphilis, Herpes, Bacterial Vaginosis, Yeast, and Trichomoniasis. Traditional Testing   [facility] [facility] [address], [place] [phone]  Monday thru Friday  Call for an appointment  [facility]- [facility]  [address] [facility] [phone] Monday thru Friday  Call for anappointment.  If you have any questions about this information please call [phone]. 08/07/2011

## 2016-01-25 NOTE — ED Provider Notes (Signed)
CSN: [id number]     Arrival date & time 01/25/16  7829 History   First MD Initiated Contact with Patient 01/25/16 [phone]     Chief Complaint  Patient presents with  . Exposure to STD     (Consider location/radiation/quality/duration/timing/severity/associated sxs/prior Treatment) HPI Dale Wells is a(n) 30 y.o. male who presents for exposure to STD. This is the patient's third visit to the emergency department for STD symptoms or treatment since January 2017. The patient tested positive for chlamydia and January. April 7. The patient came in and tested positive for gonorrhea. Patient was treated for both. Patient is a very poor historian and is vague about what his partner exposed him to. He states that she was treated both times.  Review of EMR shows that the patient tested positive for syphilis, however, attempts to contact the patient, both by phone and by postal service failed. The patient has a penicillin allergy listed. He states that he was a baby and he is not really sure what his allergy is but thinks he might have had hives. He denies a known history of anaphylaxis. I do consult placed to infectious disease regarding appropriate treatment in this patient with potential penicillin allergy. Patient denies any active symptoms at this time. He denies discharge or rashes. States that he is monogamous with his partner and denies sex with men. Marland Kitchen History reviewed. No pertinent past medical history. Past Surgical History  Procedure Laterality Date  . Appendectomy     History reviewed. No pertinent family history. Social History  Substance Use Topics  . Smoking status: Current Every Day Smoker -- 0.50 packs/day    Types: Cigarettes  . Smokeless tobacco: None  . Alcohol Use: No    Review of Systems  Ten systems reviewed and are negative for acute change, except as noted in the HPI.    Allergies  Penicillins  Home Medications   Prior to Admission medications   Not on File    BP 128/73 mmHg  Pulse 77  Temp(Src) 98.1 F (36.7 C) (Oral)  Resp 18  Ht 6' (1.829 m)  Wt 79.833 kg  BMI 23.86 kg/m2  SpO2 100% Physical Exam  Constitutional: He appears well-developed and well-nourished. No distress.  HENT:  Head: Normocephalic and atraumatic.  Eyes: Conjunctivae are normal. No scleral icterus.  Neck: Normal range of motion. Neck supple.  Cardiovascular: Normal rate, regular rhythm and normal heart sounds.   Pulmonary/Chest: Effort normal and breath sounds normal. No respiratory distress.  Abdominal: Soft. There is no tenderness.  Genitourinary: Testes normal. Cremasteric reflex is present. Right testis shows no mass, no swelling and no tenderness. Left testis shows no mass, no swelling and no tenderness. Circumcised. Discharge (clear discharge at the meatus) found.  Musculoskeletal: He exhibits no edema.  Neurological: He is alert.  Skin: Skin is warm and dry. He is not diaphoretic.  Psychiatric: His behavior is normal.  Nursing note and vitals reviewed.   ED Course  Procedures (including critical care time) Labs Review Labs Reviewed  HIV ANTIBODY (ROUTINE TESTING)  URINALYSIS, ROUTINE W REFLEX MICROSCOPIC (NOT AT [facility])  GC/CHLAMYDIA PROBE AMP ([place]) NOT AT [facility]    Imaging Review No results found. I have personally reviewed and evaluated these images and lab results as part of my medical decision-making.   EKG Interpretation None      MDM   Final diagnoses:  None    10:05 AM BP 128/73 mmHg  Pulse 77  Temp(Src) 98.1 F (36.7 C) (  Oral)  Resp 18  Ht 6' (1.829 m)  Wt 79.833 kg  BMI 23.86 kg/m2  SpO2 100%  I spoke with Dr. Drue SecondSnider who recommends giving decadron and benadryl. Patient repeat HIV, RPR, gonorrhea and chlamydia cultures. Patient treated today with 250 IM Rocephin, 1 g of azithromycin, Decadron, Benadryl and 2.4 million units of IM penicillin G. I have given the patient follow-up with infectious disease clinic. Discussed  safe sex practices and return precautions. He appears safe for discharge at this time.      Arthor Captainbigail Keonia Pasko, PA-C 01/25/16 1619  Arby BarretteMarcy Pfeiffer, MD 02/07/16 [phone]

## 2016-01-26 LAB — HIV ANTIBODY (ROUTINE TESTING W REFLEX): HIV SCREEN 4TH GENERATION: NONREACTIVE

## 2016-01-28 LAB — GC/CHLAMYDIA PROBE AMP (~~LOC~~) NOT AT ARMC
CHLAMYDIA, DNA PROBE: NEGATIVE
Neisseria Gonorrhea: NEGATIVE

## 2016-04-12 ENCOUNTER — Telehealth: Payer: Self-pay | Admitting: *Deleted

## 2016-04-12 NOTE — Telephone Encounter (Signed)
No response to phone or letter sent to home regarding (+)GC, treatment received at ER visit

## 2016-07-18 ENCOUNTER — Encounter (HOSPITAL_COMMUNITY): Payer: Self-pay

## 2016-07-18 ENCOUNTER — Emergency Department (HOSPITAL_COMMUNITY)
Admission: EM | Admit: 2016-07-18 | Discharge: 2016-07-18 | Disposition: A | Discharge status: Home / Self Care | Payer: Self-pay | Attending: Emergency Medicine | Admitting: Emergency Medicine

## 2016-07-18 DIAGNOSIS — Z202 Contact with and (suspected) exposure to infections with a predominantly sexual mode of transmission: Secondary | ICD-10-CM | POA: Insufficient documentation

## 2016-07-18 DIAGNOSIS — F17210 Nicotine dependence, cigarettes, uncomplicated: Secondary | ICD-10-CM | POA: Insufficient documentation

## 2016-07-18 MED ORDER — METRONIDAZOLE 500 MG PO TABS
2000.0000 mg | ORAL_TABLET | Freq: Once | ORAL | Status: AC
  Administered 2016-07-18: 2000 mg via ORAL
  Filled 2016-07-18: qty 4

## 2016-07-18 MED ORDER — AZITHROMYCIN 250 MG PO TABS
1000.0000 mg | ORAL_TABLET | Freq: Once | ORAL | Status: AC
  Administered 2016-07-18: 1000 mg via ORAL
  Filled 2016-07-18: qty 4

## 2016-07-18 MED ORDER — CEFTRIAXONE SODIUM 250 MG IJ SOLR
250.0000 mg | Freq: Once | INTRAMUSCULAR | Status: AC
  Administered 2016-07-18: 250 mg via INTRAMUSCULAR
  Filled 2016-07-18: qty 250

## 2016-07-18 MED ORDER — LIDOCAINE HCL (PF) 1 % IJ SOLN
INTRAMUSCULAR | Status: AC
  Administered 2016-07-18: 1 mL via INTRAMUSCULAR
  Filled 2016-07-18: qty 5

## 2016-07-18 NOTE — ED Triage Notes (Signed)
Pt states recent contact with Trichomonas dx. Pt here for STD check. Pt denies any drainage or discharge, pt denies any pain or burning with urination.

## 2016-07-18 NOTE — ED Provider Notes (Signed)
[facility]EMER[facility] DEPT Provider Note   CSN: [id number] Arrival date & time: 07/18/16  2210  By signing my name below, I, Placido SouLogan Joldersma, attest that this documentation has been prepared under the direction and in the presence of Leonia Heatherly, PA-C. Electronically Signed: Placido SouLogan Joldersma, ED Scribe. 07/18/16. 10:47 PM.   History   Chief Complaint Chief Complaint  Patient presents with  . Exposure to STD    HPI HPI Comments: Dale Wells is a 30 y.o. male who presents to the Emergency Department requesting an STD check. Pt states his girlfriend was dx with trichomonas three days ago. Pt was dx with syphilis on 01/25/2016. He denies penile pain, scrotal pain, dysuria, penile discharge or any complaints at this time.   The history is provided by the patient. No language interpreter was used.    History reviewed. No pertinent past medical history.  There are no active problems to display for this patient.   Past Surgical History:  Procedure Laterality Date  . APPENDECTOMY       Home Medications    Prior to Admission medications   Not on File    Family History History reviewed. No pertinent family history.  Social History Social History  Substance Use Topics  . Smoking status: Current Every Day Smoker    Packs/day: 0.50    Types: Cigarettes  . Smokeless tobacco: Never Used  . Alcohol use No     Allergies   Penicillins   Review of Systems Review of Systems  Constitutional: Negative for chills and fever.  Genitourinary: Negative for discharge, dysuria, penile pain and testicular pain.   Physical Exam Updated Vital Signs BP 129/74 (BP Location: Left Arm)   Pulse 79   Temp 97.6 F (36.4 C) (Oral)   Resp 17   Ht 6' (1.829 m)   Wt 180 lb 9.6 oz (81.9 kg)   SpO2 98%   BMI 24.49 kg/m   Physical Exam  Constitutional: He is oriented to person, place, and time. He appears well-developed and well-nourished.  HENT:  Head: Normocephalic and atraumatic.    Eyes: EOM are normal.  Neck: Normal range of motion.  Cardiovascular: Normal rate.   Pulmonary/Chest: Effort normal. No respiratory distress.  Abdominal: Soft.  Genitourinary: Penis normal. No penile tenderness.  Genitourinary Comments: Normal scrotum  Musculoskeletal: Normal range of motion.  Neurological: He is alert and oriented to person, place, and time.  Skin: Skin is warm and dry.  Psychiatric: He has a normal mood and affect.  Nursing note and vitals reviewed.  ED Treatments / Results  Labs (all labs ordered are listed, but only abnormal results are displayed) Labs Reviewed - No data to display  EKG  EKG Interpretation None       Radiology No results found.  Procedures Procedures  DIAGNOSTIC STUDIES: Oxygen Saturation is 98% on RA, normal by my interpretation.    COORDINATION OF CARE: 10:46 PM Discussed next steps with pt. Pt verbalized understanding and is agreeable with the plan.    Medications Ordered in ED Medications - No data to display   Initial Impression / Assessment and Plan / ED Course  I have reviewed the triage vital signs and the nursing notes.  Pertinent labs & imaging results that were available during my care of the patient were reviewed by me and considered in my medical decision making (see chart for details).  Clinical Course    Pt arrives for asymptomatic STD check. Pt states currently getting treatment for syphilis.  Discussed  safe sexual practices. Pt is advised to follow up for free testing at local health department in the future. Pt appears safe for discharge.    Vitals:   07/18/16 2216  BP: 129/74  Pulse: 79  Resp: 17  Temp: 97.6 F (36.4 C)  TempSrc: Oral  SpO2: 98%  Weight: 81.9 kg  Height: 6' (1.829 m)    I personally performed the services described in this documentation, which was scribed in my presence. The recorded information has been reviewed and is accurate.  Final Clinical Impressions(s) / ED Diagnoses    Final diagnoses:  Exposure to STD    New Prescriptions New Prescriptions   No medications on file     Jaynie Crumble, PA-C 07/18/16 2317    Benjiman Core, MD 07/18/16 [phone]

## 2016-07-18 NOTE — Discharge Instructions (Signed)
You were treated today for trichomonas infection and possible gonorrhea and chlamydia infection. Always use protection. Follow up with health dept.

## 2016-07-18 NOTE — ED Notes (Signed)
Pt states his sexual partner tested positive for trichomonas. He denies discharge and any symptoms, but reports he's been infected before and wants to make sure he doesn't have anything.

## 2016-07-18 NOTE — ED Notes (Signed)
Patient verbalized understanding of discharge instructions and denies any further needs or questions at this time. VS stable. Patient ambulatory with steady gait.  

## 2016-07-21 LAB — GC/CHLAMYDIA PROBE AMP (~~LOC~~) NOT AT ARMC
CHLAMYDIA, DNA PROBE: NEGATIVE
NEISSERIA GONORRHEA: NEGATIVE

## 2016-08-11 ENCOUNTER — Encounter (HOSPITAL_BASED_OUTPATIENT_CLINIC_OR_DEPARTMENT_OTHER): Payer: Self-pay | Admitting: *Deleted

## 2016-08-11 ENCOUNTER — Emergency Department (HOSPITAL_BASED_OUTPATIENT_CLINIC_OR_DEPARTMENT_OTHER)
Admission: EM | Admit: 2016-08-11 | Discharge: 2016-08-11 | Disposition: A | Discharge status: Home / Self Care | Payer: Self-pay | Attending: Dermatology | Admitting: Dermatology

## 2016-08-11 DIAGNOSIS — F17210 Nicotine dependence, cigarettes, uncomplicated: Secondary | ICD-10-CM | POA: Insufficient documentation

## 2016-08-11 DIAGNOSIS — Z202 Contact with and (suspected) exposure to infections with a predominantly sexual mode of transmission: Secondary | ICD-10-CM | POA: Insufficient documentation

## 2016-08-11 DIAGNOSIS — Z5321 Procedure and treatment not carried out due to patient leaving prior to being seen by health care provider: Secondary | ICD-10-CM | POA: Insufficient documentation

## 2016-08-11 NOTE — ED Triage Notes (Signed)
Pt here for STD recheck and 2nd round of PCN for syphilis

## 2016-11-12 ENCOUNTER — Encounter (HOSPITAL_COMMUNITY): Payer: Self-pay | Admitting: Emergency Medicine

## 2016-11-12 ENCOUNTER — Emergency Department (HOSPITAL_COMMUNITY)
Admission: EM | Admit: 2016-11-12 | Discharge: 2016-11-12 | Disposition: A | Discharge status: Home / Self Care | Payer: Self-pay | Attending: Emergency Medicine | Admitting: Emergency Medicine

## 2016-11-12 DIAGNOSIS — F17210 Nicotine dependence, cigarettes, uncomplicated: Secondary | ICD-10-CM | POA: Insufficient documentation

## 2016-11-12 DIAGNOSIS — R369 Urethral discharge, unspecified: Secondary | ICD-10-CM

## 2016-11-12 DIAGNOSIS — Z711 Person with feared health complaint in whom no diagnosis is made: Secondary | ICD-10-CM

## 2016-11-12 DIAGNOSIS — R360 Urethral discharge without blood: Secondary | ICD-10-CM | POA: Insufficient documentation

## 2016-11-12 DIAGNOSIS — Z202 Contact with and (suspected) exposure to infections with a predominantly sexual mode of transmission: Secondary | ICD-10-CM | POA: Insufficient documentation

## 2016-11-12 LAB — GC/CHLAMYDIA PROBE AMP (~~LOC~~) NOT AT ARMC
Chlamydia: NEGATIVE
NEISSERIA GONORRHEA: NEGATIVE

## 2016-11-12 LAB — HIV ANTIBODY (ROUTINE TESTING W REFLEX): HIV SCREEN 4TH GENERATION: NONREACTIVE

## 2016-11-12 LAB — RPR: RPR Ser Ql: NONREACTIVE

## 2016-11-12 MED ORDER — CEFTRIAXONE SODIUM 250 MG IJ SOLR
250.0000 mg | Freq: Once | INTRAMUSCULAR | Status: AC
  Administered 2016-11-12: 250 mg via INTRAMUSCULAR
  Filled 2016-11-12: qty 250

## 2016-11-12 MED ORDER — METRONIDAZOLE 500 MG PO TABS
2000.0000 mg | ORAL_TABLET | Freq: Once | ORAL | Status: AC
  Administered 2016-11-12: 2000 mg via ORAL
  Filled 2016-11-12: qty 4

## 2016-11-12 MED ORDER — AZITHROMYCIN 250 MG PO TABS
1000.0000 mg | ORAL_TABLET | Freq: Once | ORAL | Status: AC
  Administered 2016-11-12: 1000 mg via ORAL
  Filled 2016-11-12: qty 4

## 2016-11-12 NOTE — ED Triage Notes (Signed)
Patient exposed to trichomonas. Partner with patient states she was diagnosed.

## 2016-11-12 NOTE — Discharge Instructions (Signed)
1. Medications: usual home medications 2. Treatment: rest, drink plenty of fluids,  3. Follow Up: Please followup with your primary doctor in 5-7 days for discussion of your diagnoses and further evaluation after today's visit; if you do not have a primary care doctor use the resource guide provided to find one; Please return to the ER for fevers, vomiting, or other concerns

## 2016-11-12 NOTE — ED Provider Notes (Signed)
[facility] Provider Note   CSN: [id number] Arrival date & time: 11/12/16  0042     History   Chief Complaint Chief Complaint  Patient presents with  . Exposure to STD    HPI Dale Wells is a 31 y.o. male with No major medical history presents to the Emergency Department with his girlfriend stating that he has been exposed to Trichomonas. He reports he is currently sexually active with 2 male partners. He reports some tingling with urination and penile discharge noted today. Patient reports he has not recently been tested for STDs. He denies abdominal pain, fever or chills, nausea or vomiting. No aggravating or alleviating factors. No treatments prior to arrival.   The history is provided by the patient and medical records. No language interpreter was used.    History reviewed. No pertinent past medical history.  There are no active problems to display for this patient.   Past Surgical History:  Procedure Laterality Date  . APPENDECTOMY         Home Medications    Prior to Admission medications   Not on File    Family History History reviewed. No pertinent family history.  Social History Social History  Substance Use Topics  . Smoking status: Current Every Day Smoker    Packs/day: 0.10    Types: Cigarettes  . Smokeless tobacco: Never Used  . Alcohol use No     Allergies   Penicillins   Review of Systems Review of Systems  Genitourinary: Positive for discharge.  All other systems reviewed and are negative.    Physical Exam Updated Vital Signs BP 129/80   Pulse 66   Temp 98.4 F (36.9 C) (Oral)   Resp 16   SpO2 100%   Physical Exam  Constitutional: He appears well-developed and well-nourished. No distress.  Awake, alert, nontoxic appearance  HENT:  Head: Normocephalic and atraumatic.  Mouth/Throat: Oropharynx is clear and moist. No oropharyngeal exudate.  Eyes: Conjunctivae are normal. No scleral icterus.  Neck: Normal range  of motion. Neck supple.  Cardiovascular: Normal rate, regular rhythm and intact distal pulses.   Pulmonary/Chest: Effort normal and breath sounds normal. No respiratory distress. He has no wheezes.  Equal chest expansion  Abdominal: Soft. Bowel sounds are normal. He exhibits no mass. There is no tenderness. There is no rebound and no guarding. Hernia confirmed negative in the right inguinal area and confirmed negative in the left inguinal area.  Genitourinary: Testes normal. Cremasteric reflex is present. Right testis shows no mass, no swelling and no tenderness. Right testis is descended. Cremasteric reflex is not absent on the right side. Left testis shows no mass, no swelling and no tenderness. Cremasteric reflex is not absent on the left side. Circumcised. No phimosis, paraphimosis, hypospadias, penile erythema or penile tenderness. Discharge ( Clear) found.  Genitourinary Comments: Chaperone present  Musculoskeletal: Normal range of motion. He exhibits no edema.  Lymphadenopathy: No inguinal adenopathy noted on the right or left side.  Neurological: He is alert.  Speech is clear and goal oriented Moves extremities without ataxia  Skin: Skin is warm and dry. He is not diaphoretic.  Psychiatric: He has a normal mood and affect.  Nursing note and vitals reviewed.    ED Treatments / Results  Labs (all labs ordered are listed, but only abnormal results are displayed) Labs Reviewed  RPR  HIV ANTIBODY (ROUTINE TESTING)  GC/CHLAMYDIA PROBE AMP ([place]) NOT AT [facility]   Procedures Procedures (including critical care time)  Medications  Ordered in ED Medications  cefTRIAXone (ROCEPHIN) injection 250 mg (250 mg Intramuscular Given 11/12/16 0416)  azithromycin (ZITHROMAX) tablet 1,000 mg (1,000 mg Oral Given 11/12/16 0416)  metroNIDAZOLE (FLAGYL) tablet 2,000 mg (2,000 mg Oral Given 11/12/16 0416)     Initial Impression / Assessment and Plan / ED Course  I have reviewed the triage vital  signs and the nursing notes.  Pertinent labs & imaging results that were available during my care of the patient were reviewed by me and considered in my medical decision making (see chart for details).     Presents after STD exposure. Will treat him. Clear for gonorrhea, chlamydia and trichomonas. Also pending are HIV and RPR. He is well-appearing, afebrile. No evidence of sepsis. No painful bowel movements. Doubt prostatitis.   Discussed need for discussion with all sexual partners and treatment of all partners. Patient states understanding and is in agreement with the plan.    Final Clinical Impressions(s) / ED Diagnoses   Final diagnoses:  STD exposure  Penile discharge  Concern about STD in male without diagnosis    New Prescriptions New Prescriptions   No medications on file     Dierdre ForthHannah Sheetal Lyall, PA-C 11/12/16 0417    Dione Boozeavid Glick, MD 11/12/16 [phone]

## 2017-03-28 ENCOUNTER — Emergency Department (HOSPITAL_BASED_OUTPATIENT_CLINIC_OR_DEPARTMENT_OTHER)
Admission: EM | Admit: 2017-03-28 | Discharge: 2017-03-28 | Disposition: A | Discharge status: Home / Self Care | Payer: Self-pay | Attending: Emergency Medicine | Admitting: Emergency Medicine

## 2017-03-28 ENCOUNTER — Encounter (HOSPITAL_BASED_OUTPATIENT_CLINIC_OR_DEPARTMENT_OTHER): Payer: Self-pay | Admitting: *Deleted

## 2017-03-28 DIAGNOSIS — Z202 Contact with and (suspected) exposure to infections with a predominantly sexual mode of transmission: Secondary | ICD-10-CM | POA: Insufficient documentation

## 2017-03-28 DIAGNOSIS — F17210 Nicotine dependence, cigarettes, uncomplicated: Secondary | ICD-10-CM | POA: Insufficient documentation

## 2017-03-28 MED ORDER — METRONIDAZOLE 500 MG PO TABS
2000.0000 mg | ORAL_TABLET | Freq: Once | ORAL | Status: AC
  Administered 2017-03-28: 2000 mg via ORAL
  Filled 2017-03-28: qty 4

## 2017-03-28 NOTE — ED Notes (Signed)
Pt given d/c instructions as per chart. Verbalizes understanding. No questions. 

## 2017-03-28 NOTE — ED Provider Notes (Signed)
[facility] Provider Note   CSN: [id number] Arrival date & time: 03/28/17  1941  By signing my name below, I, Thelma Barge, attest that this documentation has been prepared under the direction and in the presence of No att. providers found. Electronically Signed: Thelma Barge, Scribe. 03/28/17. 8:43 PM.  History   Chief Complaint Chief Complaint  Patient presents with  . Exposure to STD  The history is provided by the patient. No language interpreter was used.    HPI Comments: Waco Foerster is a 31 y.o. male who presents to the Emergency Department requesting treatment after exposure to trichomonas. He states his partner was diagnosed with it. He has no symptoms and denies dysuria, discharge, fevers, abdominal pain, and vomiting. Pt has no other pertinent medical histories. Symptoms are mild and constant. History reviewed. No pertinent past medical history.  There are no active problems to display for this patient.   Past Surgical History:  Procedure Laterality Date  . APPENDECTOMY         Home Medications    Prior to Admission medications   Not on File    Family History No family history on file.  Social History Social History  Substance Use Topics  . Smoking status: Current Every Day Smoker    Packs/day: 0.10    Types: Cigarettes  . Smokeless tobacco: Never Used  . Alcohol use Yes     Allergies   Penicillins   Review of Systems Review of Systems  Constitutional: Negative for fever.  Gastrointestinal: Negative for abdominal pain and vomiting.  Genitourinary: Negative for discharge and dysuria.  All other systems reviewed and are negative.    Physical Exam Updated Vital Signs BP 118/66 (BP Location: Left Arm)   Pulse 72   Temp 98.8 F (37.1 C) (Oral)   Resp 20   SpO2 98%   Physical Exam  Constitutional: He is oriented to person, place, and time. He appears well-developed and well-nourished.  HENT:  Head: Normocephalic and  atraumatic.  Cardiovascular: Normal rate and regular rhythm.   Pulmonary/Chest: Effort normal. No respiratory distress.  Musculoskeletal: Normal range of motion.  Neurological: He is alert and oriented to person, place, and time.  Skin: Skin is warm.  Psychiatric: He has a normal mood and affect. His behavior is normal.  Nursing note and vitals reviewed.    ED Treatments / Results  DIAGNOSTIC STUDIES: Oxygen Saturation is 98% on RA, normal by my interpretation.    COORDINATION OF CARE: 8:43 PM Discussed treatment plan with pt at bedside and pt agreed to plan.  Labs (all labs ordered are listed, but only abnormal results are displayed) Labs Reviewed  GC/CHLAMYDIA PROBE AMP ([place]) NOT AT [facility]    EKG  EKG Interpretation None       Radiology No results found.  Procedures Procedures (including critical care time)  Medications Ordered in ED Medications  metroNIDAZOLE (FLAGYL) tablet 2,000 mg (2,000 mg Oral Given 03/28/17 2049)     Initial Impression / Assessment and Plan / ED Course  I have reviewed the triage vital signs and the nursing notes.  Pertinent labs & imaging results that were available during my care of the patient were reviewed by me and considered in my medical decision making (see chart for details).      Pt declined HIV/syphilis tests offered at this time.  Patient here seeking treatment for Trichomonas. He is asymmetric in the department. Discussed safe sexual practices, and outpatient follow-up. Discussed side effects related  to medications. Final Clinical Impressions(s) / ED Diagnoses   Final diagnoses:  STD exposure    New Prescriptions There are no discharge medications for this patient. I personally performed the services described in this documentation, which was scribed in my presence. The recorded information has been reviewed and is accurate.    Tilden Fossaees, Jefferie Holston, MD 03/28/17 2112

## 2017-03-28 NOTE — ED Triage Notes (Signed)
Pt states he is here to be treated for trichomonas. Pt denies burning with urination or penile discharge.

## 2019-06-07 ENCOUNTER — Other Ambulatory Visit: Payer: Self-pay

## 2019-06-07 DIAGNOSIS — Z20822 Contact with and (suspected) exposure to covid-19: Secondary | ICD-10-CM

## 2019-06-09 LAB — NOVEL CORONAVIRUS, NAA: SARS-CoV-2, NAA: NOT DETECTED

## 2022-08-25 ENCOUNTER — Ambulatory Visit: Payer: Self-pay | Admitting: Family Medicine

## 2022-09-01 ENCOUNTER — Ambulatory Visit: Payer: Self-pay | Attending: Family Medicine | Admitting: Family Medicine

## 2022-09-01 ENCOUNTER — Encounter: Payer: Self-pay | Admitting: Family Medicine

## 2022-09-01 VITALS — BP 114/77 | HR 67 | Ht 72.0 in | Wt 201.0 lb

## 2022-09-01 DIAGNOSIS — Z1159 Encounter for screening for other viral diseases: Secondary | ICD-10-CM | POA: Diagnosis not present

## 2022-09-01 DIAGNOSIS — H538 Other visual disturbances: Secondary | ICD-10-CM

## 2022-09-01 DIAGNOSIS — F17200 Nicotine dependence, unspecified, uncomplicated: Secondary | ICD-10-CM | POA: Insufficient documentation

## 2022-09-01 DIAGNOSIS — L600 Ingrowing nail: Secondary | ICD-10-CM

## 2022-09-01 DIAGNOSIS — Z13228 Encounter for screening for other metabolic disorders: Secondary | ICD-10-CM | POA: Diagnosis not present

## 2022-09-01 DIAGNOSIS — R7303 Prediabetes: Secondary | ICD-10-CM

## 2022-09-01 DIAGNOSIS — F17210 Nicotine dependence, cigarettes, uncomplicated: Secondary | ICD-10-CM

## 2022-09-01 LAB — POCT GLYCOSYLATED HEMOGLOBIN (HGB A1C): Hemoglobin A1C: 5.7 % — AB (ref 4.0–5.6)

## 2022-09-01 MED ORDER — DOXYCYCLINE HYCLATE 100 MG PO TABS: 100.0000 mg | ORAL_TABLET | Freq: Two times a day (BID) | ORAL | 0 refills | Status: DC

## 2022-09-01 MED ORDER — BUPROPION HCL ER (XL) 150 MG PO TB24: 150.0000 mg | ORAL_TABLET | Freq: Every day | ORAL | 3 refills | Status: AC

## 2022-09-01 MED ORDER — NICOTINE 14 MG/24HR TD PT24: 14.0000 mg | MEDICATED_PATCH | Freq: Every day | TRANSDERMAL | 1 refills | Status: AC

## 2022-09-01 MED ORDER — BUPROPION HCL ER (XL) 150 MG PO TB24: 150.0000 mg | ORAL_TABLET | Freq: Every day | ORAL | 3 refills | Status: DC

## 2022-09-01 NOTE — Progress Notes (Signed)
Check for Diabetes Blurred vision.

## 2022-09-01 NOTE — Patient Instructions (Signed)

## 2022-09-01 NOTE — Progress Notes (Signed)
Subjective:  Patient ID: Dale Wells, male    DOB: May 19, 1986  Age: 36 y.o. MRN: [id number]  CC: New Patient (Initial Visit)   HPI Dale Wells is a 36 y.o. year old male with a history of Nicotine dependence (half a ppd x 10 years)   Interval History:  He has had blurry vision for a couple of months which has been intermittent. He does not wear glasses and denies presence of headaches. It does not affect his reading but he states when he looks far it occurs and frequency is about a couple of days [place] He would like to be screened for Diabetes due to a positive FHx  He has had some pain in the lateral aspect of his left fifth toenail and is concerned because a friend of his with a history of diabetes recently underwent an amputation.  No past medical history on file.  Past Surgical History:  Procedure Laterality Date   APPENDECTOMY      Family History  Problem Relation Age of Onset   Diabetes Mother     Social History   Socioeconomic History   Marital status: Single    Spouse name: Not on file   Number of children: Not on file   Years of education: Not on file   Highest education level: Not on file  Occupational History   Not on file  Tobacco Use   Smoking status: Every Day    Packs/day: 0.10    Types: Cigarettes   Smokeless tobacco: Never  Vaping Use   Vaping Use: Never used  Substance and Sexual Activity   Alcohol use: Yes   Drug use: No   Sexual activity: Not on file  Other Topics Concern   Not on file  Social History Narrative   Not on file   Social Determinants of Health   Financial Resource Strain: Not on file  Food Insecurity: Not on file  Transportation Needs: Not on file  Physical Activity: Not on file  Stress: Not on file  Social Connections: Not on file    Allergies  Allergen Reactions   Penicillins Hives    No outpatient medications prior to visit.   No facility-administered medications prior to visit.     ROS Review of  Systems  Constitutional:  Negative for activity change and appetite change.  HENT:  Negative for sinus pressure and sore throat.   Respiratory:  Negative for chest tightness, shortness of breath and wheezing.   Cardiovascular:  Negative for chest pain and palpitations.  Gastrointestinal:  Negative for abdominal distention, abdominal pain and constipation.  Genitourinary: Negative.   Musculoskeletal:        See HPI  Psychiatric/Behavioral:  Negative for behavioral problems and dysphoric mood.     Objective:  BP 114/77   Pulse 67   Ht 6' (1.829 m)   Wt 201 lb (91.2 kg)   SpO2 99%   BMI 27.26 kg/m      09/01/2022    9:11 AM 03/28/2017    7:51 PM 11/12/2016    3:30 AM  BP/Weight  Systolic BP 892 119 417  Diastolic BP 77 66 80  Wt. (Lbs) 201    BMI 27.26 kg/m2        Physical Exam Constitutional:      Appearance: He is well-developed.  Cardiovascular:     Rate and Rhythm: Normal rate.     Heart sounds: Normal heart sounds. No murmur heard. Pulmonary:     Effort: Pulmonary effort  is normal.     Breath sounds: Normal breath sounds. No wheezing or rales.  Chest:     Chest wall: No tenderness.  Abdominal:     General: Bowel sounds are normal. There is no distension.     Palpations: Abdomen is soft. There is no mass.     Tenderness: There is no abdominal tenderness.  Musculoskeletal:        General: Normal range of motion.     Right lower leg: No edema.     Left lower leg: No edema.     Comments: Lateral aspect of left fifth toenail with hyperpigmentation and slight tenderness  Neurological:     Mental Status: He is alert and oriented to person, place, and time.  Psychiatric:        Mood and Affect: Mood normal.        Latest Ref Rng & Units 11/22/2008    1:09 AM  CMP  Glucose 70 - 99 mg/dL 87   BUN 6 - 23 mg/dL 12   Creatinine 0.4 - 1.5 mg/dL 1.2   Sodium 135 - 145 mEq/L 142   Potassium 3.5 - 5.1 mEq/L 4.4   Chloride 96 - 112 mEq/L 104   CO2 19 - 32 mEq/L 27    Calcium 8.4 - 10.5 mg/dL 9.5     Lab Results  Component Value Date   HGBA1C 5.7 (A) 09/01/2022    Assessment & Plan:  1. Prediabetes Labs reveal prediabetes with an A1c of 5.7.  Working on a low carbohydrate diet, exercise, weight loss is recommended in order to prevent progression to type 2 diabetes mellitus.  - POCT glycosylated hemoglobin (Hb A1C)  2. Blurry vision Visual acuity is 20/20 Advised he will need to see an ophthalmologist especially since screening for diabetes is negative  3. Ingrown toenail He is penicillin allergic so I will place him on doxycycline Advised to perform warm soaks Advised to apply for the [facility] financial discount to facilitate his referral to specialist - CBC with Differential/Platelet - Ambulatory referral to Podiatry - doxycycline (VIBRA-TABS) 100 MG tablet; Take 1 tablet (100 mg total) by mouth 2 (two) times daily.  Dispense: 14 tablet; Refill: 0  4. Cigarette nicotine dependence without complication Spent 3 minutes counseling on smoking cessation and he is willing to work on quitting - nicotine (NICODERM CQ) 14 mg/24hr patch; Place 1 patch (14 mg total) onto the skin daily.  Dispense: 28 patch; Refill: 1 - buPROPion (WELLBUTRIN XL) 150 MG 24 hr tablet; Take 1 tablet (150 mg total) by mouth daily. For smoking cessation  Dispense: 30 tablet; Refill: 3  5. Screening for metabolic disorder - LP+Non-HDL Cholesterol - CMP14+EGFR  6. Need for hepatitis C screening test - HCV Ab w Reflex to Quant PCR    Meds ordered this encounter  Medications   nicotine (NICODERM CQ) 14 mg/24hr patch    Sig: Place 1 patch (14 mg total) onto the skin daily.    Dispense:  28 patch    Refill:  1   DISCONTD: buPROPion (WELLBUTRIN XL) 150 MG 24 hr tablet    Sig: Take 1 tablet (150 mg total) by mouth daily.    Dispense:  30 tablet    Refill:  3   doxycycline (VIBRA-TABS) 100 MG tablet    Sig: Take 1 tablet (100 mg total) by mouth 2 (two) times daily.     Dispense:  14 tablet    Refill:  0   buPROPion [facility]  XL) 150 MG 24 hr tablet    Sig: Take 1 tablet (150 mg total) by mouth daily. For smoking cessation    Dispense:  30 tablet    Refill:  3    Follow-up: Return in about 1 year (around 09/02/2023) for Predicabetes.       Charlott Rakes, MD, FAAFP. [facility] [place], [place]   09/01/2022, 10:25 AM

## 2022-09-02 LAB — CMP14+EGFR
ALT: 21 IU/L (ref 0–44)
AST: 25 IU/L (ref 0–40)
Albumin/Globulin Ratio: 1.8 (ref 1.2–2.2)
Albumin: 4.4 g/dL (ref 4.1–5.1)
Alkaline Phosphatase: 80 IU/L (ref 44–121)
BUN/Creatinine Ratio: 8 — ABNORMAL LOW (ref 9–20)
BUN: 11 mg/dL (ref 6–20)
Bilirubin Total: 0.6 mg/dL (ref 0.0–1.2)
CO2: 23 mmol/L (ref 20–29)
Calcium: 9.4 mg/dL (ref 8.7–10.2)
Chloride: 103 mmol/L (ref 96–106)
Creatinine, Ser: 1.32 mg/dL — ABNORMAL HIGH (ref 0.76–1.27)
Globulin, Total: 2.4 g/dL (ref 1.5–4.5)
Glucose: 89 mg/dL (ref 70–99)
Potassium: 4.4 mmol/L (ref 3.5–5.2)
Sodium: 139 mmol/L (ref 134–144)
Total Protein: 6.8 g/dL (ref 6.0–8.5)
eGFR: 72 mL/min/{1.73_m2} (ref >59)

## 2022-09-02 LAB — CBC WITH DIFFERENTIAL/PLATELET
Basophils Absolute: 0 10*3/uL (ref 0.0–0.2)
Basos: 1 %
EOS (ABSOLUTE): 0.1 10*3/uL (ref 0.0–0.4)
Eos: 2 %
Hematocrit: 45.6 % (ref 37.5–51.0)
Hemoglobin: 15.3 g/dL (ref 13.0–17.7)
Immature Grans (Abs): 0 10*3/uL (ref 0.0–0.1)
Immature Granulocytes: 0 %
Lymphocytes Absolute: 2.1 10*3/uL (ref 0.7–3.1)
Lymphs: 42 %
MCH: 30.4 pg (ref 26.6–33.0)
MCHC: 33.6 g/dL (ref 31.5–35.7)
MCV: 91 fL (ref 79–97)
Monocytes Absolute: 0.4 10*3/uL (ref 0.1–0.9)
Monocytes: 8 %
Neutrophils Absolute: 2.4 10*3/uL (ref 1.4–7.0)
Neutrophils: 47 %
Platelets: 170 10*3/uL (ref 150–450)
RBC: 5.04 x10E6/uL (ref 4.14–5.80)
RDW: 12.8 % (ref 11.6–15.4)
WBC: 5 10*3/uL (ref 3.4–10.8)

## 2022-09-02 LAB — LP+NON-HDL CHOLESTEROL
Cholesterol, Total: 163 mg/dL (ref 100–199)
HDL: 51 mg/dL (ref >39)
LDL Chol Calc (NIH): 102 mg/dL — ABNORMAL HIGH (ref 0–99)
Total Non-HDL-Chol (LDL+VLDL): 112 mg/dL (ref 0–129)
Triglycerides: 45 mg/dL (ref 0–149)
VLDL Cholesterol Cal: 10 mg/dL (ref 5–40)

## 2022-09-02 LAB — HCV INTERPRETATION

## 2022-09-02 LAB — HCV AB W REFLEX TO QUANT PCR: HCV Ab: NONREACTIVE

## 2022-09-11 ENCOUNTER — Ambulatory Visit: Payer: Self-pay | Admitting: Podiatry

## 2022-09-24 ENCOUNTER — Other Ambulatory Visit: Payer: Self-pay | Admitting: Family Medicine

## 2022-09-24 DIAGNOSIS — L600 Ingrowing nail: Secondary | ICD-10-CM

## 2022-09-25 ENCOUNTER — Ambulatory Visit (INDEPENDENT_AMBULATORY_CARE_PROVIDER_SITE_OTHER): Payer: Medicaid Other | Admitting: Podiatry

## 2022-09-25 DIAGNOSIS — Z91199 Patient's noncompliance with other medical treatment and regimen due to unspecified reason: Secondary | ICD-10-CM

## 2022-09-25 NOTE — Progress Notes (Signed)
No show for apt, no charge 

## 2022-10-16 ENCOUNTER — Ambulatory Visit (INDEPENDENT_AMBULATORY_CARE_PROVIDER_SITE_OTHER): Payer: Medicaid Other | Admitting: Podiatry

## 2022-10-16 ENCOUNTER — Other Ambulatory Visit: Payer: Self-pay | Admitting: Family Medicine

## 2022-10-16 DIAGNOSIS — L600 Ingrowing nail: Secondary | ICD-10-CM

## 2022-10-16 NOTE — Patient Instructions (Signed)

## 2022-10-16 NOTE — Progress Notes (Signed)
  Subjective:  Patient ID: Dale Wells, male    DOB: 12/16/1985,  MRN: [id number]  Chief Complaint  Patient presents with   Ingrown Toenail    Patient is here for left foot pinky toe possible ingrown.    37 y.o. male presents with chronic pain in the left fifth toe nail.  He says that it is pain with pressure on the area.  He has been growing abnormally for a long time and thinks is an ingrown.  He is hoping to have it removed permanently as it is causing pain for many years.  No past medical history on file.  Allergies  Allergen Reactions   Penicillins Hives    ROS: Negative except as per HPI above  Objective:  General: AAO x3, NAD  Dermatological: Incurvation is present along the medial and lateral nail border of the left 5th toe. There is localized edema without any erythema or increase in warmth around the nail border. There is no drainage or pus. There is no ascending cellulitis. No malodor. No open lesions or pre-ulcerative lesions.    Vascular:  Dorsalis Pedis artery and Posterior Tibial artery pedal pulses are 2/4 bilateral.  Capillary fill time < 3 sec to all digits.   Neruologic: Grossly intact via light touch bilateral. Protective threshold intact to all sites bilateral.   Musculoskeletal: No gross boney pedal deformities bilateral. No pain, crepitus, or limitation noted with foot and ankle range of motion bilateral. Muscular strength 5/5 in all groups tested bilateral.  Gait: Unassisted, Nonantalgic.    Assessment:   1. Ingrown nail of fifth toe of left foot      Plan:  Patient was evaluated and treated and all questions answered.    Ingrown Nail, left 5th toe -Patient elects to proceed with minor surgery to remove ingrown toenail today. Consent reviewed and signed by patient. -Ingrown nail excised. See procedure note. -Educated on post-procedure care including soaking. Written instructions provided and reviewed. -Patient to follow up in 2 weeks for nail  check.  Procedure: Excision of Ingrown Toenail Location: Left 5th toe  total  nail  Anesthesia: Lidocaine 1% plain; 1.5 mL and Marcaine 0.5% plain; 1.5 mL, digital block. Skin Prep: Betadine. Dressing: Silvadene; telfa; dry, sterile, compression dressing. Technique: Following skin prep, the toe was exsanguinated and a tourniquet was secured at the base of the toe. The affected nail border was freed, split with a nail splitter, and excised. Chemical matrixectomy was then performed with phenol and irrigated out with alcohol. The tourniquet was then removed and sterile dressing applied. Disposition: Patient tolerated procedure well. Patient to return in 2 weeks for follow-up.    Return in about 2 weeks (around 10/30/2022) for f/u 5th toenail total P&A.          Everitt Amber, DPM [facility]

## 2022-10-17 MED ORDER — DOXYCYCLINE HYCLATE 100 MG PO TABS: 100.0000 mg | ORAL_TABLET | Freq: Two times a day (BID) | ORAL | 0 refills | Status: DC

## 2022-11-13 ENCOUNTER — Ambulatory Visit: Payer: Medicaid Other | Admitting: Podiatry

## 2022-11-20 ENCOUNTER — Encounter: Payer: Self-pay | Admitting: Podiatry

## 2022-11-20 ENCOUNTER — Ambulatory Visit (INDEPENDENT_AMBULATORY_CARE_PROVIDER_SITE_OTHER): Payer: Medicaid Other | Admitting: Podiatry

## 2022-11-20 DIAGNOSIS — L600 Ingrowing nail: Secondary | ICD-10-CM | POA: Diagnosis not present

## 2022-11-20 NOTE — Progress Notes (Signed)
Subjective: Dale Wells is a 37 y.o.  male returns to office today for follow up evaluation after having left 5th toe total nail ingrown removal with phenol and alcohol matrixectomy approximately 4 weeks ago. Patient has been soaking using epsom salts and applying topical antibiotic covered with bandaid daily. Patient denies fevers, chills, nausea, vomiting. Denies any calf pain, chest pain, SOB.   Objective:  Vitals: Reviewed  General: Well developed, nourished, in no acute distress, alert and oriented x3   Dermatology: Skin is warm, dry and supple bilateral. Left 5th toe nail bed appears to be clean, dry, with no granular tissue and no surrounding scab. There is no surrounding erythema, edema, drainage/purulence. The remaining nails appear unremarkable at this time. There are no other lesions or other signs of infection present.  Neurovascular status: Intact. No lower extremity swelling; No pain with calf compression bilateral.  Musculoskeletal: Decreased tenderness to palpation of the left 5th toe nail bed. Muscular strength within normal limits bilateral.   Assesement and Plan: S/p phenol and alcohol matrixectomy to the  left 5th toe nail total   -Continue soaking in epsom salts twice a day followed by antibiotic ointment and a band-aid. Can leave uncovered at night. Continue this until completely healed.  -If the area has not healed in 2 weeks, call the office for follow-up appointment, or sooner if any problems arise.  -Monitor for any signs/symptoms of infection. Call the office immediately if any occur or go directly to the emergency room. Call with any questions/concerns.        Dale Wells, [place] [facility]                   11/20/2022

## 2023-03-12 ENCOUNTER — Ambulatory Visit (INDEPENDENT_AMBULATORY_CARE_PROVIDER_SITE_OTHER): Payer: Medicaid Other | Admitting: Podiatry

## 2023-03-12 ENCOUNTER — Ambulatory Visit (INDEPENDENT_AMBULATORY_CARE_PROVIDER_SITE_OTHER): Payer: Medicaid Other

## 2023-03-12 ENCOUNTER — Other Ambulatory Visit: Payer: Self-pay | Admitting: Podiatry

## 2023-03-12 DIAGNOSIS — L600 Ingrowing nail: Secondary | ICD-10-CM

## 2023-03-12 DIAGNOSIS — M79675 Pain in left toe(s): Secondary | ICD-10-CM

## 2023-03-12 NOTE — Progress Notes (Signed)
  Subjective:  Patient ID: Dale Wells, male    DOB: [birth year],  MRN: [id number]  Chief Complaint  Patient presents with   Foot Pain    Pt states his pinky toe hurts he's been staying out of hoes but other than that has done nothing else. Pt states it feels like sharp pain and throbbing also feels tender    37 y.o. male presents with concern for left fifth toe pain.  He has pain related to thick callus and ingrown nail there.  Has previously had the nail removed with phenol and alcohol matricectomy but has regrown.  No past medical history on file.  Allergies  Allergen Reactions   Penicillins Hives    ROS: Negative except as per HPI above  Objective:  General: AAO x3, NAD  Dermatological: Ingrowth noted of the left fifth toenail.  Pain on palpation of the nail.  No drainage or erythema but there is pain on palpation and significant callus and thickening and dystrophy of the nail  Vascular:  Dorsalis Pedis artery and Posterior Tibial artery pedal pulses are 2/4 bilateral.  Capillary fill time < 3 sec to all digits.   Neruologic: Grossly intact via light touch bilateral. Protective threshold intact to all sites bilateral.   Musculoskeletal: No gross boney pedal deformities bilateral. No pain, crepitus, or limitation noted with foot and ankle range of motion bilateral. Muscular strength 5/5 in all groups tested bilateral.  Gait: Unassisted, Nonantalgic.   No images are attached to the encounter.  Assessment:   1. Ingrown nail of fifth toe of left foot   2. Toe pain, left      Plan:  Patient was evaluated and treated and all questions answered.  Ingrown Nail, left fifth toe -Patient elects to proceed with minor surgery to remove ingrown toenail today. Consent reviewed and signed by patient. -Ingrown nail excised. See procedure note. -Educated on post-procedure care including soaking. Written instructions provided and reviewed. -Patient to follow up in 2 weeks for nail  check.  Procedure: Excision of Ingrown Toenail Location: Left 5th toe total phenol matrix Anesthesia: Lidocaine 1% plain; 1.5 mL and Marcaine 0.5% plain; 1.5 mL, digital block. Skin Prep: Betadine. Dressing: Silvadene; telfa; dry, sterile, compression dressing. Technique: Following skin prep, the toe was exsanguinated and a tourniquet was secured at the base of the toe. The affected nail border was freed, split with a nail splitter, and excised. Chemical matrixectomy was then performed with phenol and irrigated out with alcohol. The tourniquet was then removed and sterile dressing applied. Disposition: Patient tolerated procedure well. Patient to return in 2 weeks for follow-up.    Return if symptoms worsen or fail to improve.          Corinna Gab, DPM [facility]

## 2023-03-12 NOTE — Patient Instructions (Signed)

## 2023-03-13 ENCOUNTER — Telehealth: Payer: Self-pay | Admitting: *Deleted

## 2023-03-13 NOTE — Telephone Encounter (Signed)
Called and spoke with the patient and informed him of the message below.  Patient verbalized understanding and agreed.    Patient stated that he came in today and had his toe redressed.//AB/CMA

## 2023-03-13 NOTE — Telephone Encounter (Signed)
-----   Message from Louann Sjogren, DPM sent at 03/13/2023 10:43 AM EDT ----- Regarding: RE: Patient wants toe rewrapped He was ok to take the dressing off but need to soak the foot in epsom salts and warm water and then apply antibiotic ointment and a bandaid ----- Message ----- From: Verdie Shire, CMA Sent: 03/13/2023  10:41 AM EDT To: Louann Sjogren, DPM Subject: Patient wants toe rewrapped                    Patient called stating that he was seen on yesterday by Dr. Annamary Rummage for Ingrown nail (L) fifth toe.  He took the bandage off last night, around (10:00), and he wanted to know if he took the bandage off to soon.  Asked the patient if the toe was bleeding and he stated yes.    Patient wants to come in and have his toe redressed today.  Please advise.

## 2023-06-04 DIAGNOSIS — U071 COVID-19: Secondary | ICD-10-CM | POA: Diagnosis not present

## 2023-09-02 ENCOUNTER — Ambulatory Visit: Payer: Medicaid Other | Attending: Family Medicine | Admitting: Family Medicine

## 2023-11-10 ENCOUNTER — Encounter: Payer: Self-pay | Admitting: Family Medicine

## 2023-11-10 ENCOUNTER — Ambulatory Visit: Payer: Commercial Managed Care - HMO | Attending: Family Medicine | Admitting: Family Medicine

## 2023-11-10 VITALS — BP 122/75 | HR 75 | Ht 72.0 in | Wt 170.6 lb

## 2023-11-10 DIAGNOSIS — G8929 Other chronic pain: Secondary | ICD-10-CM | POA: Diagnosis not present

## 2023-11-10 DIAGNOSIS — F17210 Nicotine dependence, cigarettes, uncomplicated: Secondary | ICD-10-CM

## 2023-11-10 DIAGNOSIS — L219 Seborrheic dermatitis, unspecified: Secondary | ICD-10-CM | POA: Diagnosis not present

## 2023-11-10 DIAGNOSIS — R7303 Prediabetes: Secondary | ICD-10-CM | POA: Diagnosis not present

## 2023-11-10 DIAGNOSIS — K089 Disorder of teeth and supporting structures, unspecified: Secondary | ICD-10-CM | POA: Diagnosis not present

## 2023-11-10 LAB — POCT GLYCOSYLATED HEMOGLOBIN (HGB A1C): HbA1c, POC (prediabetic range): 5.7 % (ref 5.7–6.4)

## 2023-11-10 MED ORDER — CLINDAMYCIN HCL 300 MG PO CAPS: 300.0000 mg | ORAL_CAPSULE | Freq: Two times a day (BID) | ORAL | 0 refills | Status: AC

## 2023-11-10 NOTE — Patient Instructions (Signed)
VISIT SUMMARY:  During today's visit, we discussed your ongoing scalp itching and hair loss, dental pain, smoking habits, and prediabetes management. We also reviewed your general health maintenance and provided recommendations for a healthy lifestyle.  YOUR PLAN:  -SCALP DERMATITIS: Scalp dermatitis is a condition that causes chronic itching and hair loss, often accompanied by flaking. Since over-the-counter medicated shampoos have not provided relief, you have been referred to a dermatologist for further evaluation and management.  -DENTAL PAIN: Your dental pain, which has been ongoing for three months and is associated with sensitivity to hot and cold foods, will be addressed by a dentist. You have been referred for evaluation and possible extraction. In the meantime, Clindamycin has been prescribed to control any infection.  -TOBACCO USE: You are currently smoking less than half a pack a day and are not ready to quit at this time. We encourage you to consider smoking cessation in the future and will offer resources when you are ready.  -PREDIABETES: Prediabetes means your blood sugar levels are higher than normal but not high enough to be classified as diabetes. Your A1C is well controlled at 5.7. We will continue with your current management plan and have ordered fasting labs, including cholesterol, to monitor your condition.  -GENERAL HEALTH MAINTENANCE: You declined the flu and pneumonia vaccines at this time. We encourage you to maintain a healthy diet, including plenty of vegetables, for overall health.  INSTRUCTIONS:  Please follow up with the dermatologist and dentist as referred. Continue with your current management plan for prediabetes and complete the fasting labs as ordered. If you decide to quit smoking, let us know so we can provide resources to help you.

## 2023-11-10 NOTE — Progress Notes (Signed)
Subjective:  Patient ID: Dale Wells, male    DOB: Jul 24, 1986  Age: 38 y.o. MRN: [id number]  CC: Medical Management of Chronic Issues (Referral to dermatology and dental)   HPI Dale Wells is a 38 y.o. year old male with a history of prediabetes, nicotine dependence.  Interval History: Discussed the use of AI scribe software for clinical note transcription with the patient, who gave verbal consent to proceed.  He presents with scalp itching and hair loss for several years. He has tried various medicated shampoos, including Head and Shoulders and Selsun Blue, without relief. He reports occasional flaking in certain spots. He also reports dental pain for the past three months, which has been difficult to manage due to his previous dental office closing. The pain is associated with sensitivity to hot and cold foods and irritation in the back of the mouth. He has been trying to quit smoking, currently smoking less than half a pack a day.  His Prediabetes is diet controlled.       No past medical history on file.  Past Surgical History:  Procedure Laterality Date   APPENDECTOMY      Family History  Problem Relation Age of Onset   Diabetes Mother     Social History   Socioeconomic History   Marital status: Single    Spouse name: Not on file   Number of children: Not on file   Years of education: Not on file   Highest education level: Not on file  Occupational History   Not on file  Tobacco Use   Smoking status: Every Day    Current packs/day: 0.10    Types: Cigarettes   Smokeless tobacco: Never  Vaping Use   Vaping status: Never Used  Substance and Sexual Activity   Alcohol use: Yes   Drug use: No   Sexual activity: Not on file  Other Topics Concern   Not on file  Social History Narrative   Not on file   Social Drivers of Health   Financial Resource Strain: Medium Risk (11/10/2023)   Overall Financial Resource Strain (CARDIA)    Difficulty of Paying Living  Expenses: Somewhat hard  Food Insecurity: No Food Insecurity (11/10/2023)   Hunger Vital Sign    Worried About Running Out of Food in the Last Year: Never true    Ran Out of Food in the Last Year: Never true  Transportation Needs: No Transportation Needs (11/10/2023)   PRAPARE - Administrator, Civil Service (Medical): No    Lack of Transportation (Non-Medical): No  Physical Activity: Inactive (11/10/2023)   Exercise Vital Sign    Days of Exercise per Week: 0 days    Minutes of Exercise per Session: 0 min  Stress: No Stress Concern Present (11/10/2023)   [employer] of Occupational Health - Occupational Stress Questionnaire    Feeling of Stress : Not at all  Social Connections: Moderately Isolated (11/10/2023)   Social Connection and Isolation Panel [NHANES]    Frequency of Communication with Friends and Family: Three times a week    Frequency of Social Gatherings with Friends and Family: Twice a week    Attends Religious Services: Never    Database administrator or Organizations: No    Attends Banker Meetings: Never    Marital Status: Married    Allergies  Allergen Reactions   Penicillins Hives    Outpatient Medications Prior to Visit  Medication Sig Dispense Refill   buPROPion [facility]  XL) 150 MG 24 hr tablet Take 1 tablet (150 mg total) by mouth daily. For smoking cessation (Patient not taking: Reported on 11/10/2023) 30 tablet 3   nicotine (NICODERM CQ) 14 mg/24hr patch Place 1 patch (14 mg total) onto the skin daily. (Patient not taking: Reported on 11/10/2023) 28 patch 1   No facility-administered medications prior to visit.     ROS Review of Systems  Constitutional:  Negative for activity change and appetite change.  HENT:  Negative for sinus pressure and sore throat.   Respiratory:  Negative for chest tightness, shortness of breath and wheezing.   Cardiovascular:  Negative for chest pain and palpitations.  Gastrointestinal:  Negative  for abdominal distention, abdominal pain and constipation.  Genitourinary: Negative.   Musculoskeletal: Negative.   Psychiatric/Behavioral:  Negative for behavioral problems and dysphoric mood.     Objective:  BP 122/75   Pulse 75   Ht 6' (1.829 m)   Wt 170 lb 9.6 oz (77.4 kg)   SpO2 100%   BMI 23.14 kg/m      11/10/2023    3:25 PM 09/01/2022    9:11 AM 03/28/2017    7:51 PM  BP/Weight  Systolic BP 122 114 118  Diastolic BP 75 77 66  Wt. (Lbs) 170.6 201   BMI 23.14 kg/m2 27.26 kg/m2       Physical Exam Constitutional:      Appearance: He is well-developed.  Cardiovascular:     Rate and Rhythm: Normal rate.     Heart sounds: Normal heart sounds. No murmur heard. Pulmonary:     Effort: Pulmonary effort is normal.     Breath sounds: Normal breath sounds. No wheezing or rales.  Chest:     Chest wall: No tenderness.  Abdominal:     General: Bowel sounds are normal. There is no distension.     Palpations: Abdomen is soft. There is no mass.     Tenderness: There is no abdominal tenderness.  Musculoskeletal:        General: Normal range of motion.     Right lower leg: No edema.     Left lower leg: No edema.  Neurological:     Mental Status: He is alert and oriented to person, place, and time.  Psychiatric:        Mood and Affect: Mood normal.        Latest Ref Rng & Units 09/01/2022    9:54 AM 11/22/2008    1:09 AM  CMP  Glucose 70 - 99 mg/dL 89  87   BUN 6 - 20 mg/dL 11  12   Creatinine 1.61 - 1.27 mg/dL 0.96  1.2   Sodium 045 - 144 mmol/L 139  142   Potassium 3.5 - 5.2 mmol/L 4.4  4.4   Chloride 96 - 106 mmol/L 103  104   CO2 20 - 29 mmol/L 23  27   Calcium 8.7 - 10.2 mg/dL 9.4  9.5   Total Protein 6.0 - 8.5 g/dL 6.8    Total Bilirubin 0.0 - 1.2 mg/dL 0.6    Alkaline Phos 44 - 121 IU/L 80    AST 0 - 40 IU/L 25    ALT 0 - 44 IU/L 21      Lipid Panel     Component Value Date/Time   CHOL 163 09/01/2022 0954   TRIG 45 09/01/2022 0954   HDL 51  09/01/2022 0954   LDLCALC 102 (H) 09/01/2022 0954    CBC  Component Value Date/Time   WBC 5.0 09/01/2022 0954   RBC 5.04 09/01/2022 0954   HGB 15.3 09/01/2022 0954   HCT 45.6 09/01/2022 0954   PLT 170 09/01/2022 0954   MCV 91 09/01/2022 0954   MCH 30.4 09/01/2022 0954   MCHC 33.6 09/01/2022 0954   RDW 12.8 09/01/2022 0954   LYMPHSABS 2.1 09/01/2022 0954   EOSABS 0.1 09/01/2022 0954   BASOSABS 0.0 09/01/2022 0954    Lab Results  Component Value Date   HGBA1C 5.7 11/10/2023    Assessment & Plan:      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 Previous use of antifungal shampoos without relief. Some flaking noted. -Referral to Dermatology for further evaluation and management.  Dental Pain Chronic pain for three months. Sensitivity to hot and cold. Irritation in the back of the mouth. Previous dental office closed. -Referral to Dentistry for evaluation and extraction. -Prescribe Clindamycin for infection control until dental appointment.  Tobacco Use Smoking, less than half a pack per day. Patient not ready to quit at this time. -Encourage smoking cessation and offer resources when patient is ready.  Prediabetes A1C 5.7, well controlled. -Continue current management. -Order fasting labs including cholesterol.  General Health Maintenance -Declined flu and pneumonia vaccines at this time. -Encourage healthy diet including vegetables for overall health.          Meds ordered this encounter  Medications   clindamycin (CLEOCIN) 300 MG capsule    Sig: Take 1 capsule (300 mg total) by mouth in the morning and at bedtime.    Dispense:  20 capsule    Refill:  0    Follow-up: Return in about 1 year (around 11/09/2024) for Chronic medical conditions.       Hoy Register, MD, FAAFP. [facility] [place], [place] [phone]   11/10/2023, 3:44 PM

## 2023-11-11 ENCOUNTER — Ambulatory Visit: Payer: Medicaid Other | Attending: Family Medicine

## 2023-11-11 DIAGNOSIS — Z13220 Encounter for screening for lipoid disorders: Secondary | ICD-10-CM | POA: Diagnosis not present

## 2023-11-11 DIAGNOSIS — R7303 Prediabetes: Secondary | ICD-10-CM | POA: Diagnosis not present

## 2023-11-12 ENCOUNTER — Encounter: Payer: Self-pay | Admitting: Family Medicine

## 2023-11-12 LAB — CBC WITH DIFFERENTIAL/PLATELET
Basophils Absolute: 0 10*3/uL (ref 0.0–0.2)
Basos: 1 %
EOS (ABSOLUTE): 0.1 10*3/uL (ref 0.0–0.4)
Eos: 2 %
Hematocrit: 45.9 % (ref 37.5–51.0)
Hemoglobin: 15.6 g/dL (ref 13.0–17.7)
Immature Grans (Abs): 0 10*3/uL (ref 0.0–0.1)
Immature Granulocytes: 0 %
Lymphocytes Absolute: 1.9 10*3/uL (ref 0.7–3.1)
Lymphs: 46 %
MCH: 30.8 pg (ref 26.6–33.0)
MCHC: 34 g/dL (ref 31.5–35.7)
MCV: 91 fL (ref 79–97)
Monocytes Absolute: 0.3 10*3/uL (ref 0.1–0.9)
Monocytes: 8 %
Neutrophils Absolute: 1.8 10*3/uL (ref 1.4–7.0)
Neutrophils: 43 %
Platelets: 190 10*3/uL (ref 150–450)
RBC: 5.07 x10E6/uL (ref 4.14–5.80)
RDW: 13.1 % (ref 11.6–15.4)
WBC: 4.1 10*3/uL (ref 3.4–10.8)

## 2023-11-12 LAB — LP+NON-HDL CHOLESTEROL
Cholesterol, Total: 158 mg/dL (ref 100–199)
HDL: 55 mg/dL (ref >39)
LDL Chol Calc (NIH): 92 mg/dL (ref 0–99)
Total Non-HDL-Chol (LDL+VLDL): 103 mg/dL (ref 0–129)
Triglycerides: 52 mg/dL (ref 0–149)
VLDL Cholesterol Cal: 11 mg/dL (ref 5–40)

## 2023-11-12 LAB — CMP14+EGFR
ALT: 22 [IU]/L (ref 0–44)
AST: 22 [IU]/L (ref 0–40)
Albumin: 4.1 g/dL (ref 4.1–5.1)
Alkaline Phosphatase: 80 [IU]/L (ref 44–121)
BUN/Creatinine Ratio: 10 (ref 9–20)
BUN: 13 mg/dL (ref 6–20)
Bilirubin Total: 0.5 mg/dL (ref 0.0–1.2)
CO2: 22 mmol/L (ref 20–29)
Calcium: 9.5 mg/dL (ref 8.7–10.2)
Chloride: 104 mmol/L (ref 96–106)
Creatinine, Ser: 1.24 mg/dL (ref 0.76–1.27)
Globulin, Total: 2.7 g/dL (ref 1.5–4.5)
Glucose: 83 mg/dL (ref 70–99)
Potassium: 4.5 mmol/L (ref 3.5–5.2)
Sodium: 143 mmol/L (ref 134–144)
Total Protein: 6.8 g/dL (ref 6.0–8.5)
eGFR: 77 mL/min/{1.73_m2} (ref >59)

## 2023-12-07 DIAGNOSIS — L739 Follicular disorder, unspecified: Secondary | ICD-10-CM | POA: Diagnosis not present

## 2023-12-25 ENCOUNTER — Encounter (HOSPITAL_BASED_OUTPATIENT_CLINIC_OR_DEPARTMENT_OTHER): Payer: Self-pay

## 2023-12-25 ENCOUNTER — Emergency Department (HOSPITAL_BASED_OUTPATIENT_CLINIC_OR_DEPARTMENT_OTHER)

## 2023-12-25 ENCOUNTER — Other Ambulatory Visit: Payer: Self-pay

## 2023-12-25 DIAGNOSIS — M79672 Pain in left foot: Secondary | ICD-10-CM | POA: Diagnosis not present

## 2023-12-25 DIAGNOSIS — F17210 Nicotine dependence, cigarettes, uncomplicated: Secondary | ICD-10-CM | POA: Insufficient documentation

## 2023-12-25 NOTE — ED Triage Notes (Signed)
 Woke up this morning with his left foot swollen and hurting, states the swelling had went down some throughout the day but the swelling has increased again tonight. No trauma mentioned to cause the swelling. He has taken tylenol and motrin for it today.

## 2023-12-26 ENCOUNTER — Emergency Department (HOSPITAL_BASED_OUTPATIENT_CLINIC_OR_DEPARTMENT_OTHER)
Admission: EM | Admit: 2023-12-26 | Discharge: 2023-12-26 | Disposition: A | Discharge status: Home / Self Care | Attending: Emergency Medicine | Admitting: Emergency Medicine

## 2023-12-26 DIAGNOSIS — M79672 Pain in left foot: Secondary | ICD-10-CM | POA: Diagnosis not present

## 2023-12-26 MED ORDER — PREDNISONE 50 MG PO TABS
60.0000 mg | ORAL_TABLET | Freq: Once | ORAL | Status: AC
  Administered 2023-12-26: 60 mg via ORAL
  Filled 2023-12-26: qty 1

## 2023-12-26 MED ORDER — HYDROCODONE-ACETAMINOPHEN 5-325 MG PO TABS: 1.0000 | ORAL_TABLET | ORAL | 0 refills | Status: AC | PRN

## 2023-12-26 MED ORDER — PREDNISONE 20 MG PO TABS: 40.0000 mg | ORAL_TABLET | Freq: Every day | ORAL | 0 refills | Status: AC

## 2023-12-26 MED ORDER — OXYCODONE HCL 5 MG PO TABS
5.0000 mg | ORAL_TABLET | Freq: Once | ORAL | Status: AC
  Administered 2023-12-26: 5 mg via ORAL
  Filled 2023-12-26: qty 1

## 2023-12-26 NOTE — Discharge Instructions (Signed)
 You were evaluated in the Emergency Department and after careful evaluation, we did not find any emergent condition requiring admission or further testing in the hospital.  Your exam/testing today is overall reassuring.  Symptoms may be due to gout.  Take the prednisone medication daily with first home dose morning of 3-30.  Can use the Norco medication as needed for pain.  Follow-up with your primary care doctor to discuss this emergency department visit.  Please return to the Emergency Department if you experience any worsening of your condition.   Thank you for allowing [place] to be a part of your care.

## 2023-12-26 NOTE — ED Provider Notes (Signed)
 [facility] Emergency Department Provider Note MRN:  [id number]  Arrival date & time: 12/26/23     Chief Complaint   Foot Pain   History of Present Illness   Dale Wells is a 38 y.o. year-old male with no pertinent past medical history presenting to the ED with chief complaint of foot pain.  Pain and swelling to the left lateral foot, woke up with pain yesterday morning, not going away, getting worse, now unable to walk on the foot.  No other pain anywhere else, no fever, no trauma, no new activities.  Review of Systems  A thorough review of systems was obtained and all systems are negative except as noted in the HPI and PMH.   Patient's Health History   History reviewed. No pertinent past medical history.  Past Surgical History:  Procedure Laterality Date   APPENDECTOMY      Family History  Problem Relation Age of Onset   Diabetes Mother     Social History   Socioeconomic History   Marital status: Single    Spouse name: Not on file   Number of children: Not on file   Years of education: Not on file   Highest education level: Not on file  Occupational History   Not on file  Tobacco Use   Smoking status: Every Day    Current packs/day: 0.10    Types: Cigarettes   Smokeless tobacco: Never  Vaping Use   Vaping status: Never Used  Substance and Sexual Activity   Alcohol use: Yes   Drug use: No   Sexual activity: Not on file  Other Topics Concern   Not on file  Social History Narrative   Not on file   Social Drivers of Health   Financial Resource Strain: Medium Risk (11/10/2023)   Overall Financial Resource Strain (CARDIA)    Difficulty of Paying Living Expenses: Somewhat hard  Food Insecurity: No Food Insecurity (11/10/2023)   Hunger Vital Sign    Worried About Running Out of Food in the Last Year: Never true    Ran Out of Food in the Last Year: Never true  Transportation Needs: No Transportation Needs (11/10/2023)   PRAPARE -  Administrator, Civil Service (Medical): No    Lack of Transportation (Non-Medical): No  Physical Activity: Inactive (11/10/2023)   Exercise Vital Sign    Days of Exercise per Week: 0 days    Minutes of Exercise per Session: 0 min  Stress: No Stress Concern Present (11/10/2023)   [employer] of Occupational Health - Occupational Stress Questionnaire    Feeling of Stress : Not at all  Social Connections: Moderately Isolated (11/10/2023)   Social Connection and Isolation Panel [NHANES]    Frequency of Communication with Friends and Family: Three times a week    Frequency of Social Gatherings with Friends and Family: Twice a week    Attends Religious Services: Never    Database administrator or Organizations: No    Attends Banker Meetings: Never    Marital Status: Married  Catering manager Violence: Not At Risk (11/10/2023)   Humiliation, Afraid, Rape, and Kick questionnaire    Fear of Current or Ex-Partner: No    Emotionally Abused: No    Physically Abused: No    Sexually Abused: No     Physical Exam   Vitals:   12/25/23 2323  BP: 125/77  Pulse: 68  Resp: 18  Temp: (!) 97.4 F (36.3 C)  SpO2: 100%    CONSTITUTIONAL: Well-appearing, NAD NEURO/PSYCH:  Alert and oriented x 3, no focal deficits EYES:  eyes equal and reactive ENT/NECK:  no LAD, no JVD CARDIO: Regular rate, well-perfused, normal S1 and S2 PULM:  CTAB no wheezing or rhonchi GI/GU:  non-distended, non-tender MSK/SPINE:  No gross deformities, no edema SKIN:  no rash, atraumatic   *Additional and/or pertinent findings included in MDM below  Diagnostic and Interventional Summary    EKG Interpretation Date/Time:    Ventricular Rate:    PR Interval:    QRS Duration:    QT Interval:    QTC Calculation:   R Axis:      Text Interpretation:         Labs Reviewed - No data to display  DG Foot Complete Left  Final Result      Medications  predniSONE (DELTASONE) tablet 60  mg (60 mg Oral Given 12/26/23 0228)  oxyCODONE (Oxy IR/ROXICODONE) immediate release tablet 5 mg (5 mg Oral Given 12/26/23 0228)     Procedures  /  Critical Care Procedures  ED Course and Medical Decision Making  Initial Impression and Ddx Patient has swelling and tenderness to the left lateral foot near the base of the fifth metatarsal.  Question gout versus some kind of trauma the patient does not remember them.  There is no increased warmth, patient has no fever, highly doubt infectious cause.  Past medical/surgical history that increases complexity of ED encounter: None  Interpretation of Diagnostics I personally reviewed the foot x-ray and my interpretation is as follows: No abnormalities    Patient Reassessment and Ultimate Disposition/Management     Patient is appropriate for empiric treatment of gout flare with strict return precautions, PCP follow-up.  Patient management required discussion with the following services or consulting groups:  None  Complexity of Problems Addressed Acute illness or injury that poses threat of life of bodily function  Additional Data Reviewed and Analyzed Further history obtained from: Further history from spouse/family member  Additional Factors Impacting ED Encounter Risk Prescriptions  Elmer Sow. Pilar Plate, MD [facility] Emergency Medicine [facility] [email or web address]  Final Clinical Impressions(s) / ED Diagnoses     ICD-10-CM   1. Left foot pain  M79.672       ED Discharge Orders          Ordered    HYDROcodone-acetaminophen (NORCO/VICODIN) 5-325 MG tablet  Every 4 hours PRN        12/26/23 0229    predniSONE (DELTASONE) 20 MG tablet  Daily        12/26/23 0229             Discharge Instructions Discussed with and Provided to Patient:    Discharge Instructions      You were evaluated in the Emergency Department and after careful evaluation, we did not find any emergent condition requiring  admission or further testing in the hospital.  Your exam/testing today is overall reassuring.  Symptoms may be due to gout.  Take the prednisone medication daily with first home dose morning of 3-30.  Can use the Norco medication as needed for pain.  Follow-up with your primary care doctor to discuss this emergency department visit.  Please return to the Emergency Department if you experience any worsening of your condition.   Thank you for allowing [place] to be a part of your care.      Sabas Sous, MD 12/26/23 [phone]

## 2024-01-26 DIAGNOSIS — L739 Follicular disorder, unspecified: Secondary | ICD-10-CM | POA: Diagnosis not present

## 2024-01-29 ENCOUNTER — Encounter: Payer: Self-pay | Admitting: Family Medicine

## 2024-01-29 DIAGNOSIS — R7303 Prediabetes: Secondary | ICD-10-CM | POA: Insufficient documentation

## 2024-01-31 DIAGNOSIS — L905 Scar conditions and fibrosis of skin: Secondary | ICD-10-CM | POA: Diagnosis not present

## 2024-03-04 ENCOUNTER — Emergency Department (HOSPITAL_COMMUNITY): Admission: EM | Admit: 2024-03-04 | Discharge: 2024-03-04 | Disposition: A | Discharge status: Home / Self Care

## 2024-03-04 ENCOUNTER — Emergency Department (HOSPITAL_COMMUNITY)

## 2024-03-04 ENCOUNTER — Other Ambulatory Visit: Payer: Self-pay

## 2024-03-04 DIAGNOSIS — R519 Headache, unspecified: Secondary | ICD-10-CM | POA: Diagnosis present

## 2024-03-04 DIAGNOSIS — R58 Hemorrhage, not elsewhere classified: Secondary | ICD-10-CM | POA: Diagnosis not present

## 2024-03-04 DIAGNOSIS — S161XXA Strain of muscle, fascia and tendon at neck level, initial encounter: Secondary | ICD-10-CM | POA: Diagnosis not present

## 2024-03-04 DIAGNOSIS — Z23 Encounter for immunization: Secondary | ICD-10-CM | POA: Diagnosis not present

## 2024-03-04 DIAGNOSIS — S0121XA Laceration without foreign body of nose, initial encounter: Secondary | ICD-10-CM | POA: Diagnosis not present

## 2024-03-04 DIAGNOSIS — Y92410 Unspecified street and highway as the place of occurrence of the external cause: Secondary | ICD-10-CM | POA: Diagnosis not present

## 2024-03-04 DIAGNOSIS — R61 Generalized hyperhidrosis: Secondary | ICD-10-CM | POA: Diagnosis not present

## 2024-03-04 DIAGNOSIS — S0990XA Unspecified injury of head, initial encounter: Secondary | ICD-10-CM | POA: Diagnosis not present

## 2024-03-04 DIAGNOSIS — S0993XA Unspecified injury of face, initial encounter: Secondary | ICD-10-CM | POA: Diagnosis not present

## 2024-03-04 DIAGNOSIS — S199XXA Unspecified injury of neck, initial encounter: Secondary | ICD-10-CM | POA: Diagnosis not present

## 2024-03-04 MED ORDER — LIDOCAINE HCL (PF) 1 % IJ SOLN
2.0000 mL | Freq: Once | INTRAMUSCULAR | Status: AC
  Administered 2024-03-04: 2 mL
  Filled 2024-03-04: qty 30

## 2024-03-04 MED ORDER — TETANUS-DIPHTH-ACELL PERTUSSIS 5-2.5-18.5 LF-MCG/0.5 IM SUSY
0.5000 mL | PREFILLED_SYRINGE | Freq: Once | INTRAMUSCULAR | Status: AC
  Administered 2024-03-04: 0.5 mL via INTRAMUSCULAR
  Filled 2024-03-04: qty 0.5

## 2024-03-04 MED ORDER — LIDOCAINE 5 % EX PTCH: 1.0000 | MEDICATED_PATCH | CUTANEOUS | 0 refills | Status: AC

## 2024-03-04 MED ORDER — OXYCODONE-ACETAMINOPHEN 5-325 MG PO TABS
1.0000 | ORAL_TABLET | Freq: Once | ORAL | Status: AC
  Administered 2024-03-04: 1 via ORAL
  Filled 2024-03-04: qty 1

## 2024-03-04 MED ORDER — METHOCARBAMOL 500 MG PO TABS
500.0000 mg | ORAL_TABLET | Freq: Two times a day (BID) | ORAL | 0 refills | Status: AC | PRN
Start: 2024-03-04 — End: ?

## 2024-03-04 MED ORDER — LIDOCAINE 5 % EX PTCH
1.0000 | MEDICATED_PATCH | Freq: Once | CUTANEOUS | Status: DC
  Administered 2024-03-04: 1 via TRANSDERMAL
  Filled 2024-03-04: qty 1

## 2024-03-04 MED ORDER — TETANUS-DIPHTH-ACELL PERTUSSIS 5-2.5-18.5 LF-MCG/0.5 IM SUSY
PREFILLED_SYRINGE | INTRAMUSCULAR | Status: DC
Start: 2024-03-04 — End: 2024-03-04
  Filled 2024-03-04: qty 0.5

## 2024-03-04 NOTE — ED Notes (Signed)
 Overridden TDAP should be linked to given TDAP. Overridden because drawer failed
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
## 2024-03-04 NOTE — ED Notes (Signed)
 Patient is resting comfortably.

## 2024-03-04 NOTE — Discharge Instructions (Signed)
 Please take 1000 mg of Tylenol  alternating with 400 mg of ibuprofen every 4 hours for baseline pain control.  You may also use the lidocaine  patches and muscle relaxers for additional pain relief.  Please follow-up with your primary doctor.  You will need to return in 1 week for suture removal.  Many urgent cares or your primary doctor may also be able to remove the stitches for you.  Return if develop fevers, chills, redness around wound, begins draining pus, painful eye movement, severe headache, chest pain, shortness of breath, weakness in your upper extremities, abdominal pain, inability eat or drink due to nausea vomiting or you develop any new or worsening symptoms that are concerning to you.

## 2024-03-12 DIAGNOSIS — R0981 Nasal congestion: Secondary | ICD-10-CM | POA: Diagnosis not present

## 2024-03-12 DIAGNOSIS — Z4802 Encounter for removal of sutures: Secondary | ICD-10-CM | POA: Diagnosis not present

## 2024-03-22 DIAGNOSIS — K029 Dental caries, unspecified: Secondary | ICD-10-CM | POA: Diagnosis not present

## 2024-03-22 DIAGNOSIS — K0850 Unsatisfactory restoration of tooth, unspecified: Secondary | ICD-10-CM | POA: Diagnosis not present

## 2024-03-22 DIAGNOSIS — Z0120 Encounter for dental examination and cleaning without abnormal findings: Secondary | ICD-10-CM | POA: Diagnosis not present

## 2024-04-04 DIAGNOSIS — L720 Epidermal cyst: Secondary | ICD-10-CM | POA: Diagnosis not present

## 2024-04-26 DIAGNOSIS — Z0120 Encounter for dental examination and cleaning without abnormal findings: Secondary | ICD-10-CM | POA: Diagnosis not present

## 2024-05-11 DIAGNOSIS — K029 Dental caries, unspecified: Secondary | ICD-10-CM | POA: Diagnosis not present

## 2024-06-02 DIAGNOSIS — K029 Dental caries, unspecified: Secondary | ICD-10-CM | POA: Diagnosis not present

## 2024-07-19 DIAGNOSIS — Z0120 Encounter for dental examination and cleaning without abnormal findings: Secondary | ICD-10-CM | POA: Diagnosis not present

## 2024-07-19 DIAGNOSIS — K029 Dental caries, unspecified: Secondary | ICD-10-CM | POA: Diagnosis not present

## 2024-08-23 ENCOUNTER — Other Ambulatory Visit: Payer: Self-pay

## 2024-08-23 ENCOUNTER — Encounter (HOSPITAL_BASED_OUTPATIENT_CLINIC_OR_DEPARTMENT_OTHER): Payer: Self-pay

## 2024-08-23 ENCOUNTER — Emergency Department (HOSPITAL_BASED_OUTPATIENT_CLINIC_OR_DEPARTMENT_OTHER)
Admission: EM | Admit: 2024-08-23 | Discharge: 2024-08-23 | Disposition: A | Discharge status: Home / Self Care | Attending: Emergency Medicine | Admitting: Emergency Medicine

## 2024-08-23 ENCOUNTER — Emergency Department (HOSPITAL_BASED_OUTPATIENT_CLINIC_OR_DEPARTMENT_OTHER)

## 2024-08-23 DIAGNOSIS — W228XXA Striking against or struck by other objects, initial encounter: Secondary | ICD-10-CM | POA: Diagnosis not present

## 2024-08-23 DIAGNOSIS — S91204A Unspecified open wound of right lesser toe(s) with damage to nail, initial encounter: Secondary | ICD-10-CM | POA: Diagnosis not present

## 2024-08-23 DIAGNOSIS — S99921A Unspecified injury of right foot, initial encounter: Secondary | ICD-10-CM | POA: Diagnosis present

## 2024-08-23 DIAGNOSIS — M79674 Pain in right toe(s): Secondary | ICD-10-CM

## 2024-08-23 DIAGNOSIS — S91209A Unspecified open wound of unspecified toe(s) with damage to nail, initial encounter: Secondary | ICD-10-CM

## 2024-08-23 NOTE — ED Provider Notes (Signed)
 [place] [facility] AT [facility] [place] Provider Note   CSN: [id number] Arrival date & time: 08/23/24  9057     Patient presents with: Toe Pain   Dale Wells is a 38 y.o. male who presents to the emergency department with a chief complaint of right second toe pain.  Patient states that he was following someone out a door this morning and they slammed the door behind them and it directly made contact with the toenail portion of his second toe.  Patient states that he feels like part of his toenail is disconnected.  Patient has been ambulatory since the injury.  Denies significant blood loss.  Past medical history significant for nicotine  dependence as well as prediabetes.    Toe Pain       Prior to Admission medications   Medication Sig Start Date End Date Taking? Authorizing Provider  buPROPion  (WELLBUTRIN  XL) 150 MG 24 hr tablet Take 1 tablet (150 mg total) by mouth daily. For smoking cessation Patient not taking: Reported on 11/10/2023 09/01/22   Newlin, Enobong, MD  clindamycin  (CLEOCIN ) 300 MG capsule Take 1 capsule (300 mg total) by mouth in the morning and at bedtime. 11/10/23   Newlin, Enobong, MD  HYDROcodone -acetaminophen  (NORCO/VICODIN) 5-325 MG tablet Take 1 tablet by mouth every 4 (four) hours as needed. 12/26/23   Theadore Ozell HERO, MD  lidocaine  (LIDODERM ) 5 % Place 1 patch onto the skin daily. Remove & Discard patch within 12 hours or as directed by MD 03/04/24   Neysa Caron PARAS, DO  methocarbamol  (ROBAXIN ) 500 MG tablet Take 1 tablet (500 mg total) by mouth 2 (two) times daily as needed for muscle spasms. 03/04/24   Neysa Caron PARAS, DO  nicotine  (NICODERM CQ ) 14 mg/24hr patch Place 1 patch (14 mg total) onto the skin daily. Patient not taking: Reported on 11/10/2023 09/01/22   Newlin, Enobong, MD    Allergies: Penicillins    Review of Systems  Skin:  Positive for wound (Wound to right second toenail).    Updated Vital Signs BP 130/78   Pulse (!) 58    Temp 98.3 F (36.8 C) (Oral)   Resp 18   Ht 6' (1.829 m)   Wt 72.6 kg   SpO2 100%   BMI 21.70 kg/m   Physical Exam Vitals and nursing note reviewed.  Constitutional:      General: He is awake. He is not in acute distress.    Appearance: Normal appearance. He is not ill-appearing, toxic-appearing or diaphoretic.  HENT:     Head: Normocephalic and atraumatic.  Eyes:     General: No scleral icterus. Pulmonary:     Effort: Pulmonary effort is normal. No respiratory distress.  Musculoskeletal:        General: Normal range of motion.     Right lower leg: No edema.     Left lower leg: No edema.     Comments: Grossly normal range of motion of all 4 extremities, patient is able to flex and extend all toes of right lower extremity, no significant tenderness with palpation of second digit of right lower extremity or right foot, only tender directly over toenail of second digit of right foot, patient ambulatory without assistance  Right lower extremity neurovascularly intact  Skin:    General: Skin is warm.     Capillary Refill: Capillary refill takes less than 2 seconds.     Comments: Very small wound present to proximal nail fold area on the medial side of  the toenail base of the second toe of the right foot, no subungal hematoma noted, nail appears to be approximately 50% detached from nail bed, no sign of nail bed injury  Neurological:     General: No focal deficit present.     Mental Status: He is alert and oriented to person, place, and time.  Psychiatric:        Mood and Affect: Mood normal.        Behavior: Behavior normal. Behavior is cooperative.        (all labs ordered are listed, but only abnormal results are displayed) Labs Reviewed - No data to display  EKG: None  Radiology: DG Toe 2nd Right Result Date: 08/23/2024 EXAM: 1 VIEW(S) XRAY OF THE RIGHT TOES 08/23/2024 10:59:54 AM COMPARISON: None available. CLINICAL HISTORY: kicked door FINDINGS: BONES AND JOINTS:  No acute fracture. No focal osseous lesion. No joint dislocation. SOFT TISSUES: Mild soft tissue swelling. IMPRESSION: 1. No acute fracture or dislocation. 2. Mild soft tissue swelling. Electronically signed by: Donnice Mania MD 08/23/2024 11:42 AM EST RP Workstation: [id number]     Procedures   Medications Ordered in the ED - No data to display                                  Medical Decision Making Amount and/or Complexity of Data Reviewed Radiology: ordered.   Patient presents to the ED for concern of right toe pain, this involves an extensive number of treatment options, and is a complaint that carries with it a high risk of complications and morbidity.  The differential diagnosis includes fracture, dislocation, subungual hematoma, nailbed injury, etc.   Co morbidities that complicate the patient evaluation  Nicotine  dependence, prediabetes   Imaging Studies ordered:  I ordered imaging studies including x-ray of right second toe I independently visualized and interpreted imaging which showed no acute osseous abnormality I agree with the radiologist interpretation   Medicines ordered and prescription drug management:  I have reviewed the patients home medicines and have made adjustments as needed   Test Considered:  None   Critical Interventions:  None   Problem List / ED Course:  38 year old male, vital signs stable, presents emergency department with a chief complaint of right toe pain, patient was following someone on a door whenever they stand the door behind them and it hit his second toe on his right foot directly over the area of the nailbed On physical exam patient has good range of motion of all toes of right lower extremity, no obvious injury to the nailbed, no subungual hematoma, nail appears still 50% attached to the nailbed only with mild lifting present over the medial portion, there is also a small wound present to the medial cuticle area Will  obtain x-ray to rule out possibility of fracture Clinically I see no need for nail removal at this time, will favor supportive care and give patient follow-up information with podiatry as needed, discussed with patient who also favors supportive care at this time as opposed to nail removal today Number given for podiatry as needed Tetanus shot up-to-date already Return precautions given Patient discharged    Reevaluation:  After the interventions noted above, I reevaluated the patient and found that they have :stayed the same   Social Determinants of Health:  none   Dispostion:  After consideration of the diagnostic results and the patients response to treatment, I feel that  the patient would benefit from outpatient therapy as described, follow-up with podiatry as needed.     Final diagnoses:  Toe pain, right  Avulsion of toenail, initial encounter    ED Discharge Orders     None          Dayanira Giovannetti F, PA-C 08/23/24 1926    Bernard Drivers, MD 08/24/24 [phone]

## 2024-08-23 NOTE — ED Notes (Signed)
 Nail cleaned and dry dressing applied. Instructions reviewed and information provided on nail avulsion. Pt states understanding. Ambulatory at discharge.

## 2024-08-23 NOTE — ED Triage Notes (Signed)
 Ambulatory to triage. Reports hitting right second toe on door. Toenail coming off

## 2024-08-23 NOTE — Discharge Instructions (Signed)
 It was a pleasure taking care of you today.  Based on your history and physical exam as well as your imaging I feel you are safe for discharge.  The x-ray today completed of the second toe of right your foot did not show any evidence of fracture.  Because the nail is still attached we have pushed it back underneath the skin, please keep it covered and please keep your toenail trimmed to avoid it catching on anything and ripping the rest of the nail off.  The nailbed appears intact today.  I have attached the information for podiatry if needed, however this injury should heal on its own, and a new nail should regrow.  If you experience any of the following symptoms including but not limited to severe pain, signs of infection including pus, drainage, redness, fever, chills, excessive blood under the toenail, or other concerning symptom please return to the emergency department or seek further medical care.  Please make your primary care provider aware of your workup and all findings today. Try and ensure that the nail stays back connected to the nail bed to ensure good regrowth.

## 2024-09-08 ENCOUNTER — Ambulatory Visit: Admitting: Podiatry

## 2024-11-09 ENCOUNTER — Ambulatory Visit: Payer: Medicaid Other | Admitting: Family Medicine
